# Patient Record
Sex: Female | Born: 1945 | Race: White | Hispanic: No | Marital: Married | Smoking: Never smoker
Health system: Southern US, Community
[De-identification: ages and names within clinical notes are randomized; demographics above are authoritative.]

## PROBLEM LIST (undated history)

## (undated) DIAGNOSIS — I10 Essential (primary) hypertension: Secondary | ICD-10-CM

## (undated) DIAGNOSIS — E119 Type 2 diabetes mellitus without complications: Secondary | ICD-10-CM

## (undated) DIAGNOSIS — Z8249 Family history of ischemic heart disease and other diseases of the circulatory system: Secondary | ICD-10-CM

## (undated) DIAGNOSIS — E785 Hyperlipidemia, unspecified: Secondary | ICD-10-CM

## (undated) DIAGNOSIS — E209 Hypoparathyroidism, unspecified: Secondary | ICD-10-CM

## (undated) DIAGNOSIS — Z794 Long term (current) use of insulin: Secondary | ICD-10-CM

## (undated) DIAGNOSIS — H269 Unspecified cataract: Secondary | ICD-10-CM

## (undated) HISTORY — DX: Unspecified cataract: H26.9

## (undated) HISTORY — DX: Hypoparathyroidism, unspecified: E20.9

## (undated) HISTORY — PX: CATARACT EXTRACTION, BILATERAL: SHX1313

## (undated) HISTORY — PX: BREAST CYST EXCISION: SHX579

## (undated) HISTORY — PX: TRIGGER FINGER RELEASE: SHX641

## (undated) HISTORY — PX: THYROIDECTOMY: SHX17

---

## 1998-09-18 ENCOUNTER — Other Ambulatory Visit: Admission: RE | Admit: 1998-09-18 | Discharge: 1998-09-18 | Payer: Self-pay | Admitting: *Deleted

## 1998-10-21 ENCOUNTER — Ambulatory Visit (HOSPITAL_BASED_OUTPATIENT_CLINIC_OR_DEPARTMENT_OTHER): Admission: RE | Admit: 1998-10-21 | Discharge: 1998-10-21 | Payer: Self-pay | Admitting: Orthopedic Surgery

## 1999-10-12 ENCOUNTER — Other Ambulatory Visit: Admission: RE | Admit: 1999-10-12 | Discharge: 1999-10-12 | Payer: Self-pay | Admitting: *Deleted

## 2001-08-28 ENCOUNTER — Other Ambulatory Visit: Admission: RE | Admit: 2001-08-28 | Discharge: 2001-08-28 | Payer: Self-pay | Admitting: *Deleted

## 2001-11-02 ENCOUNTER — Emergency Department (HOSPITAL_COMMUNITY): Admission: EM | Admit: 2001-11-02 | Discharge: 2001-11-02 | Payer: Self-pay | Admitting: Emergency Medicine

## 2003-01-10 ENCOUNTER — Ambulatory Visit (HOSPITAL_BASED_OUTPATIENT_CLINIC_OR_DEPARTMENT_OTHER): Admission: RE | Admit: 2003-01-10 | Discharge: 2003-01-10 | Payer: Self-pay | Admitting: Orthopedic Surgery

## 2003-01-10 ENCOUNTER — Ambulatory Visit (HOSPITAL_COMMUNITY): Admission: RE | Admit: 2003-01-10 | Discharge: 2003-01-10 | Payer: Self-pay | Admitting: Orthopedic Surgery

## 2003-08-12 ENCOUNTER — Other Ambulatory Visit: Admission: RE | Admit: 2003-08-12 | Discharge: 2003-08-12 | Payer: Self-pay | Admitting: *Deleted

## 2005-09-02 ENCOUNTER — Other Ambulatory Visit: Admission: RE | Admit: 2005-09-02 | Discharge: 2005-09-02 | Payer: Self-pay | Admitting: *Deleted

## 2011-05-23 ENCOUNTER — Ambulatory Visit (INDEPENDENT_AMBULATORY_CARE_PROVIDER_SITE_OTHER): Payer: BC Managed Care – PPO | Admitting: Family Medicine

## 2011-05-23 VITALS — BP 149/67 | HR 79 | Temp 99.0°F | Resp 16 | Ht 64.5 in | Wt 131.2 lb

## 2011-05-23 DIAGNOSIS — H109 Unspecified conjunctivitis: Secondary | ICD-10-CM

## 2011-05-23 MED ORDER — MOXIFLOXACIN HCL (2X DAY) 0.5 % OP SOLN: 1.0000 [drp] | Freq: Two times a day (BID) | OPHTHALMIC | Status: DC

## 2011-05-23 NOTE — Progress Notes (Signed)
  Patient Name: Abigail Green Date of Birth: 18-Aug-1945 Medical Record Number: [id number] Gender: female Date of Encounter: 05/23/2011  History of Present Illness:  Abigail Green is a 66 y.o. very pleasant female patient who presents with the following:  On Monday morning she noted chest congestion- used mucinex and tussin.  Then on Thursday she had nausea and vomiting then her granddaughter was born later on that day!  She felt better and went to [place].  She felt nauseated on Friday and adjusted her insulin to account for this.  Then yesterday am her right eye was red and running- then her left eye started as well.  Her eyes were crusted shut this am.  She is a Geophysicist/field seismologist.   Nausea/ vomiting is now resolved. She did have some loose stools when she was ill.   No fever.  Her glucose was 160 earlier today.   Sporadic cough.  Over all she feels better but her eyes are worry some.  Wears reading glasses only- vision is unchanged,  Eyes do not hurt- have been a little itchy.    There is no problem list on file for this patient.  No past medical history on file. No past surgical history on file. History  Substance Use Topics  . Smoking status: Never Smoker   . Smokeless tobacco: Not on file  . Alcohol Use: Not on file   No family history on file. Allergies  Allergen Reactions  . Penicillins Rash    Medication list has been reviewed and updated.  Review of Systems: As per HPI- otherwise negative.   Physical Examination: Filed Vitals:   05/23/11 1807  BP: 149/67  Pulse: 79  Temp: 99 F (37.2 C)  TempSrc: Oral  Resp: 16  Height: 5' 4.5" (1.638 m)  Weight: 131 lb 3.2 oz (59.512 kg)    Body mass index is 22.17 kg/(m^2).  GEN: WDWN, NAD, Non-toxic, A & O x 3 HEENT: Atraumatic, Normocephalic. Neck supple. No masses, No LAD. PEERL, EOMI, fundoscopic exam wnl.  Conjunctivae are injected bilateraly, some crusting .  TM, oropharynx wnl Ears and Nose: No external  deformity. CV: RRR, No M/G/R. No JVD. No thrill. No extra heart sounds. PULM: CTA B, no wheezes, crackles, rhonchi. No retractions. No resp. distress. No accessory muscle use. ABD: S, NT, ND, +BS. No rebound. No HSM. EXTR: No c/c/e NEURO Normal gait.  PSYCH: Normally interactive. Conversant. Not depressed or anxious appearing.  Calm demeanor.    Assessment and Plan: 1. Conjunctivitis  Moxifloxacin HCl 0.5 % SOLN    Use moxeza as above- let us know if not better in a couple of days-Sooner if worse.   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

## 2013-10-31 ENCOUNTER — Ambulatory Visit (HOSPITAL_COMMUNITY)
Admission: RE | Admit: 2013-10-31 | Discharge: 2013-10-31 | Disposition: A | Discharge status: Home / Self Care | Payer: Medicare Other | Source: Ambulatory Visit | Attending: Cardiovascular Disease | Admitting: Cardiovascular Disease

## 2013-10-31 ENCOUNTER — Other Ambulatory Visit (HOSPITAL_COMMUNITY): Payer: Self-pay | Admitting: Endocrinology

## 2013-10-31 DIAGNOSIS — R0609 Other forms of dyspnea: Secondary | ICD-10-CM | POA: Diagnosis not present

## 2013-10-31 DIAGNOSIS — I359 Nonrheumatic aortic valve disorder, unspecified: Secondary | ICD-10-CM

## 2013-10-31 DIAGNOSIS — Z8249 Family history of ischemic heart disease and other diseases of the circulatory system: Secondary | ICD-10-CM | POA: Diagnosis not present

## 2013-10-31 DIAGNOSIS — E785 Hyperlipidemia, unspecified: Secondary | ICD-10-CM | POA: Diagnosis not present

## 2013-10-31 DIAGNOSIS — I10 Essential (primary) hypertension: Secondary | ICD-10-CM | POA: Insufficient documentation

## 2013-10-31 DIAGNOSIS — E119 Type 2 diabetes mellitus without complications: Secondary | ICD-10-CM | POA: Diagnosis not present

## 2013-10-31 DIAGNOSIS — R0989 Other specified symptoms and signs involving the circulatory and respiratory systems: Principal | ICD-10-CM

## 2013-10-31 DIAGNOSIS — R0602 Shortness of breath: Secondary | ICD-10-CM

## 2013-10-31 NOTE — Progress Notes (Signed)
2D Echocardiogram Complete.  10/31/2013   Dayven Linsley, RDCS  

## 2015-07-31 ENCOUNTER — Encounter (HOSPITAL_COMMUNITY): Payer: Medicare Other

## 2015-08-12 ENCOUNTER — Ambulatory Visit (HOSPITAL_COMMUNITY)
Admission: RE | Admit: 2015-08-12 | Discharge: 2015-08-12 | Disposition: A | Discharge status: Home / Self Care | Payer: Medicare Other | Source: Ambulatory Visit | Attending: Vascular Surgery | Admitting: Vascular Surgery

## 2015-08-12 ENCOUNTER — Other Ambulatory Visit (HOSPITAL_COMMUNITY): Payer: Self-pay | Admitting: Endocrinology

## 2015-08-12 DIAGNOSIS — I6523 Occlusion and stenosis of bilateral carotid arteries: Secondary | ICD-10-CM | POA: Insufficient documentation

## 2015-08-12 DIAGNOSIS — R0989 Other specified symptoms and signs involving the circulatory and respiratory systems: Secondary | ICD-10-CM

## 2015-08-12 LAB — VAS US CAROTID
LCCAPSYS: 158 cm/s
LEFT ECA DIAS: 0 cm/s
LICAPSYS: -87 cm/s
Left CCA dist dias: 20 cm/s
Left CCA dist sys: 138 cm/s
Left CCA prox dias: 25 cm/s
Left ICA dist dias: -37 cm/s
Left ICA dist sys: -189 cm/s
Left ICA prox dias: -19 cm/s
RCCAPDIAS: 15 cm/s
RCCAPSYS: 116 cm/s
RIGHT CCA MID DIAS: 22 cm/s
Right cca dist sys: -86 cm/s

## 2015-12-25 ENCOUNTER — Encounter: Payer: Self-pay | Admitting: Cardiology

## 2015-12-25 ENCOUNTER — Other Ambulatory Visit: Payer: Self-pay | Admitting: Cardiology

## 2015-12-25 NOTE — Progress Notes (Signed)
Abigail Green M  Date of visit:  12/25/2015 DOB:  10-19-1945    Age:  70 yrs. Medical record number:  [id number]     Account number:  [id number] Primary Care Provider: [place], [place] A ____________________________ CURRENT DIAGNOSES  1. Chest pain, unspecified  2. Encounter for preprocedural cardiovascular examination  3. Type 1 Diabetes Mellitus Without Complications  4. Carotid artery disease-Bilateral  5. Hyperlipidemia ____________________________ ALLERGIES  Penicillins, Rash ____________________________ MEDICATIONS  1. OneTouch Verio strips  2. Lantus 100 unit/mL subcutaneous solution, 34 units daily  3. multivitamin tablet  4. Synthroid 112 mcg tablet, 1 p.o. daily except on Wed., Sun., take 1/2 pill  5. Micardis 80 mg tablet, 1 p.o. daily  6. aspirin 81 mg tablet,delayed release  7. Accu-Chek Compact Plus Test Strips  8. Humalog KwikPen 100 unit/mL subcutaneous, inject 7 units breakfst, 8 to10 units lunch, and 10 units with supper  9. Lipitor 80 mg tablet, QOD  10. Fish Oil 1,000 mg (120 mg-180 mg) capsule, 1 p.o. daily  11. Calcium 600 600 mg calcium (1,500 mg) tablet, 2 p.o. b.i.d.  12. hydrocodone-homatropine 5 mg-1.5 mg/5 mL (5 mL) syrup, PRN  13. turmeric root extract 500 mg capsule, 750 mg daily  14. Vitamin C 500 mg tablet, 1 p.o. daily ____________________________ HISTORY OF PRESENT ILLNESS This 70 year old female has a long-standing history of diabetes mellitus. She had had some chest tightness for several months prior to referral which usually occurs at night and is as described a vague tightness that would be relieved if she took aspirin. He was not associated with food was not pleuritic. She was able to walk and do other activities during the day without precipitating tightness. She has a very mild carotid artery disease. She is normotensive at home. She has a significant family history of cardiac disease with a brother having had bypass grafting, both mother and  father and other relatives with coronary artery disease. She had a myocardial perfusion scan done on the 12th and she only walked for 5 minutes and 40 seconds into Bruce stage II and had 2 mm of ST segment depression laterally associated with T wave change sin recovery. Perfusion imaging showed a mid to distal anterolateral wall defect consistent with ischemia. Ejection fraction was 63%. Catheterization is advised to further evaluate. She normally denies PND, orthopnea or edema. She doesn't have any significant complications from diabetes. Renal function has previously been normal. ____________________________ PAST HISTORY  Past Medical Illnesses:  hypoparathyroid, hypothyroid, HTN, hyperlipidemia, vitiligo, perniciious anemia, DM-insulin dependent;  Cardiovascular Illnesses:  no previous history of cardiac disease;  Surgical Procedures:  trigger finger repair, cataract extraction, cesarean section, tubal ligation, thyroidectomy, breast biopsy;  NYHA Classification:  I;  Canadian Angina Classification:  Class 0: Asymptomatic;  Cardiology Procedures-Invasive:  no previous interventional or invasive cardiology procedures;  Cardiology Procedures-Noninvasive:  treadmill Myoview October 2017;  LVEF of 63% documented via nuclear study on 12/18/2015,   ____________________________ CARDIO-PULMONARY TEST DATES EKG Date:  12/02/2015;  Nuclear Study Date:  12/18/2015;  Echocardiography Date: 10/31/2013;   ____________________________ FAMILY HISTORY Brother -- Brother dead, Myocardial infarction Brother -- Diabetes mellitus, History of coronary artery bypass grafting, Coronary Artery Disease, Brother alive with problem Father -- Father dead Mother -- Coronary Artery Disease Sister -- Sister alive with problem Sister -- Sister dead, COPD Sister -- Sister dead, Death due to natural cause Sister -- Sister alive with problem ____________________________ SOCIAL HISTORY Alcohol Use:  no alcohol use;  Smoking:   never smoked;  Diet:  caffeine use-1-2 per day;  Lifestyle:  married and 2 children;  Exercise:  exercises daily and walking 7 days per week;  Occupation:  retired;  Residence:  lives with husband;  Job Description:  Optometrist;   ____________________________ REVIEW OF SYSTEMS General:  denies recent weight change, fatique or change in exercise tolerance.  Integumentary:vitilago Eyes: cataract extraction bilaterally Ears, Nose, Throat, Mouth:  denies any hearing loss, epistaxis, hoarseness or difficulty speaking. Respiratory: cough, mild dyspnea with exertion Cardiovascular:  please review HPI Abdominal: denies dyspepsia, GI bleeding, constipation, or diarrheaGenitourinary-Female: no dysuria, urgency, frequency, UTIs, or stress incontinence Musculoskeletal:  arthritis of the right knee Neurological:  denies headaches, stroke, or TIA Psychiatric:  denies depression or anxiety Hematological/Immunologic:  denies any food allergies, bleeding disorders. ____________________________ PHYSICAL EXAMINATION VITAL SIGNS  Blood Pressure:  120/62 Sitting, Left arm, regular cuff  , 132/60 Standing, Left arm and regular cuff   Pulse:  96/min. Weight:  132.00 lbs. Height:  65"BMI: 22  Constitutional:  pleasant white female, in no acute distress Skin:  warm and dry to touch, no apparent skin lesions, or masses noted. Head:  normocephalic, normal hair pattern, no masses or tenderness Eyes:  EOMS Intact, PERRLA, C and S clear, Funduscopic exam not done. ENT:  ears, nose and throat reveal no gross abnormalities.  Dentition good. Neck:  soft right carotid bruit, healed anterior thyroid scar, no JVD Chest:  normal symmetry, clear to auscultation. Cardiac:  regular rhythm, normal S1 and S2, No S3 or S4, no murmurs, gallops or rubs detected. Abdomen:  abdomen soft,non-tender, no masses, no hepatospenomegaly, or aneurysm noted Peripheral Pulses:  the femoral,dorsalis pedis, and posterior tibial pulses are full  and equal bilaterally with no bruits auscultated. Extremities & Back:  no deformities, clubbing, cyanosis, erythema or edema observed. Normal muscle strength and tone. Neurological:  no gross motor or sensory deficits noted, affect appropriate, oriented x3. ____________________________ MOST RECENT LIPID PANEL 07/23/15  CHOL TOTL 131 mg/dl, LDL 55 NM, HDL 64 mg/dl, TRIGLYCER 62 mg/dl, ALT 18 u/l, ALK PHOS 44 u/l, CHOL/HDL 2.0 (Calc) and AST 32 u/l ____________________________ IMPRESSIONS/PLAN 1. Atypical chest tightness with an abnormal myocardial perfusion scan with both EKG signs of ischemia as well as mid distal anterolateral wall ischemia. 2. Long-standing diabetes mellitus 3. Hyperlipidemia 4. Mild carotid artery disease less than 40% bilaterally  Recommendations:  Cardiac catheterization was discussed with the patient and husband including risks of myocardial infarction, death, stroke, bleeding, arrhythmia, dye allergy, or renal insufficiency. She understands and is willing to proceed. Possibility of percutaneous intervention at the same setting was also discussed with the patient including risks. Procedure will be done by colleagues with [facility].  ____________________________ TODAYS ORDERS  1. Comprehensive Metabolic Panel: Today  2. Complete Blood Count: Today  3. Draw PT/INR: Today  4. PTT: Today  5. Cardiac cath at patient convenience                       ____________________________ Cardiology Physician:  Kerry Hough MD [facility]

## 2015-12-29 ENCOUNTER — Encounter (HOSPITAL_COMMUNITY): Payer: Self-pay | Admitting: Cardiology

## 2015-12-29 DIAGNOSIS — E109 Type 1 diabetes mellitus without complications: Secondary | ICD-10-CM

## 2015-12-29 DIAGNOSIS — Z8249 Family history of ischemic heart disease and other diseases of the circulatory system: Secondary | ICD-10-CM

## 2015-12-29 DIAGNOSIS — Z794 Long term (current) use of insulin: Secondary | ICD-10-CM

## 2015-12-29 DIAGNOSIS — I208 Other forms of angina pectoris: Secondary | ICD-10-CM | POA: Diagnosis present

## 2015-12-29 DIAGNOSIS — I2089 Other forms of angina pectoris: Secondary | ICD-10-CM | POA: Diagnosis present

## 2015-12-29 DIAGNOSIS — R9439 Abnormal result of other cardiovascular function study: Secondary | ICD-10-CM | POA: Diagnosis present

## 2015-12-29 DIAGNOSIS — I10 Essential (primary) hypertension: Secondary | ICD-10-CM

## 2015-12-29 DIAGNOSIS — E785 Hyperlipidemia, unspecified: Secondary | ICD-10-CM

## 2015-12-29 DIAGNOSIS — E119 Type 2 diabetes mellitus without complications: Secondary | ICD-10-CM

## 2015-12-29 HISTORY — DX: Family history of ischemic heart disease and other diseases of the circulatory system: Z82.49

## 2015-12-29 HISTORY — DX: Type 1 diabetes mellitus without complications: E10.9

## 2015-12-29 HISTORY — DX: Essential (primary) hypertension: I10

## 2015-12-29 HISTORY — DX: Hyperlipidemia, unspecified: E78.5

## 2015-12-30 ENCOUNTER — Encounter (HOSPITAL_COMMUNITY): Payer: Self-pay | Admitting: Cardiovascular Disease

## 2015-12-30 ENCOUNTER — Ambulatory Visit (HOSPITAL_COMMUNITY)
Admission: RE | Admit: 2015-12-30 | Discharge: 2015-12-30 | Disposition: A | Discharge status: Home / Self Care | Payer: Medicare Other | Source: Ambulatory Visit | Attending: Cardiology | Admitting: Cardiology

## 2015-12-30 ENCOUNTER — Encounter (HOSPITAL_COMMUNITY)
Admission: RE | Disposition: A | Discharge status: Home / Self Care | Payer: Self-pay | Source: Ambulatory Visit | Attending: Cardiology

## 2015-12-30 DIAGNOSIS — E209 Hypoparathyroidism, unspecified: Secondary | ICD-10-CM | POA: Diagnosis not present

## 2015-12-30 DIAGNOSIS — Z7982 Long term (current) use of aspirin: Secondary | ICD-10-CM | POA: Insufficient documentation

## 2015-12-30 DIAGNOSIS — E109 Type 1 diabetes mellitus without complications: Secondary | ICD-10-CM | POA: Diagnosis not present

## 2015-12-30 DIAGNOSIS — Z833 Family history of diabetes mellitus: Secondary | ICD-10-CM | POA: Insufficient documentation

## 2015-12-30 DIAGNOSIS — Z880 Allergy status to penicillin: Secondary | ICD-10-CM | POA: Insufficient documentation

## 2015-12-30 DIAGNOSIS — Z8249 Family history of ischemic heart disease and other diseases of the circulatory system: Secondary | ICD-10-CM | POA: Diagnosis not present

## 2015-12-30 DIAGNOSIS — R079 Chest pain, unspecified: Secondary | ICD-10-CM | POA: Insufficient documentation

## 2015-12-30 DIAGNOSIS — E039 Hypothyroidism, unspecified: Secondary | ICD-10-CM | POA: Diagnosis not present

## 2015-12-30 DIAGNOSIS — R9439 Abnormal result of other cardiovascular function study: Secondary | ICD-10-CM | POA: Diagnosis present

## 2015-12-30 DIAGNOSIS — D649 Anemia, unspecified: Secondary | ICD-10-CM | POA: Insufficient documentation

## 2015-12-30 DIAGNOSIS — I10 Essential (primary) hypertension: Secondary | ICD-10-CM | POA: Diagnosis not present

## 2015-12-30 DIAGNOSIS — I2584 Coronary atherosclerosis due to calcified coronary lesion: Secondary | ICD-10-CM | POA: Diagnosis not present

## 2015-12-30 DIAGNOSIS — E785 Hyperlipidemia, unspecified: Secondary | ICD-10-CM | POA: Diagnosis not present

## 2015-12-30 DIAGNOSIS — I2510 Atherosclerotic heart disease of native coronary artery without angina pectoris: Secondary | ICD-10-CM | POA: Diagnosis not present

## 2015-12-30 DIAGNOSIS — I208 Other forms of angina pectoris: Secondary | ICD-10-CM | POA: Diagnosis present

## 2015-12-30 DIAGNOSIS — I2089 Other forms of angina pectoris: Secondary | ICD-10-CM | POA: Diagnosis present

## 2015-12-30 DIAGNOSIS — E119 Type 2 diabetes mellitus without complications: Secondary | ICD-10-CM

## 2015-12-30 DIAGNOSIS — Z794 Long term (current) use of insulin: Secondary | ICD-10-CM

## 2015-12-30 HISTORY — DX: Type 2 diabetes mellitus without complications: E11.9

## 2015-12-30 HISTORY — DX: Family history of ischemic heart disease and other diseases of the circulatory system: Z82.49

## 2015-12-30 HISTORY — DX: Long term (current) use of insulin: Z79.4

## 2015-12-30 HISTORY — DX: Essential (primary) hypertension: I10

## 2015-12-30 HISTORY — DX: Hyperlipidemia, unspecified: E78.5

## 2015-12-30 HISTORY — PX: CARDIAC CATHETERIZATION: SHX172

## 2015-12-30 LAB — GLUCOSE, CAPILLARY
GLUCOSE-CAPILLARY: 196 mg/dL — AB (ref 65–99)
GLUCOSE-CAPILLARY: 198 mg/dL — AB (ref 65–99)

## 2015-12-30 SURGERY — LEFT HEART CATH AND CORONARY ANGIOGRAPHY
Anesthesia: LOCAL

## 2015-12-30 MED ORDER — INSULIN ASPART 100 UNIT/ML ~~LOC~~ SOLN
8.0000 [IU] | Freq: Once | SUBCUTANEOUS | Status: AC
  Administered 2015-12-30: 8 [IU] via SUBCUTANEOUS
  Filled 2015-12-30: qty 0.08

## 2015-12-30 MED ORDER — SODIUM CHLORIDE 0.9% FLUSH: 3.0000 mL | INTRAVENOUS | Status: DC | PRN

## 2015-12-30 MED ORDER — FENTANYL CITRATE (PF) 100 MCG/2ML IJ SOLN
INTRAMUSCULAR | Status: DC | PRN
  Administered 2015-12-30 (×2): 25 ug via INTRAVENOUS

## 2015-12-30 MED ORDER — IOPAMIDOL (ISOVUE-370) INJECTION 76%
INTRAVENOUS | Status: DC | PRN
  Administered 2015-12-30: 70 mL

## 2015-12-30 MED ORDER — HEPARIN SODIUM (PORCINE) 1000 UNIT/ML IJ SOLN
INTRAMUSCULAR | Status: AC
  Filled 2015-12-30: qty 1

## 2015-12-30 MED ORDER — SODIUM CHLORIDE 0.9% FLUSH: 3.0000 mL | Freq: Two times a day (BID) | INTRAVENOUS | Status: DC

## 2015-12-30 MED ORDER — SODIUM CHLORIDE 0.9 % IV SOLN: 250.0000 mL | INTRAVENOUS | Status: DC | PRN

## 2015-12-30 MED ORDER — MIDAZOLAM HCL 2 MG/2ML IJ SOLN
INTRAMUSCULAR | Status: AC
  Filled 2015-12-30: qty 2

## 2015-12-30 MED ORDER — HEPARIN (PORCINE) IN NACL 2-0.9 UNIT/ML-% IJ SOLN
INTRAMUSCULAR | Status: DC | PRN
  Administered 2015-12-30 (×2): via INTRA_ARTERIAL

## 2015-12-30 MED ORDER — HEPARIN (PORCINE) IN NACL 2-0.9 UNIT/ML-% IJ SOLN
INTRAMUSCULAR | Status: DC | PRN
  Administered 2015-12-30: 09:00:00

## 2015-12-30 MED ORDER — NITROGLYCERIN 1 MG/10 ML FOR IR/CATH LAB
INTRA_ARTERIAL | Status: AC
  Filled 2015-12-30: qty 10

## 2015-12-30 MED ORDER — MIDAZOLAM HCL 2 MG/2ML IJ SOLN
INTRAMUSCULAR | Status: DC | PRN
Start: 2015-12-30 — End: 2015-12-30
  Administered 2015-12-30 (×2): 1 mg via INTRAVENOUS
  Administered 2015-12-30: 2 mg via INTRAVENOUS

## 2015-12-30 MED ORDER — LIDOCAINE HCL (PF) 1 % IJ SOLN
INTRAMUSCULAR | Status: DC | PRN
  Administered 2015-12-30: 2 mL via SUBCUTANEOUS

## 2015-12-30 MED ORDER — INSULIN ASPART 100 UNIT/ML ~~LOC~~ SOLN
SUBCUTANEOUS | Status: AC
  Administered 2015-12-30: 8 [IU] via SUBCUTANEOUS
  Filled 2015-12-30: qty 1

## 2015-12-30 MED ORDER — VERAPAMIL HCL 2.5 MG/ML IV SOLN
INTRAVENOUS | Status: AC
  Filled 2015-12-30: qty 2

## 2015-12-30 MED ORDER — ONDANSETRON HCL 4 MG/2ML IJ SOLN: 4.0000 mg | Freq: Four times a day (QID) | INTRAMUSCULAR | Status: DC | PRN

## 2015-12-30 MED ORDER — SODIUM CHLORIDE 0.9 % WEIGHT BASED INFUSION: 1.0000 mL/kg/h | INTRAVENOUS | Status: DC

## 2015-12-30 MED ORDER — SODIUM CHLORIDE 0.9 % WEIGHT BASED INFUSION: 3.0000 mL/kg/h | INTRAVENOUS | Status: DC

## 2015-12-30 MED ORDER — LIDOCAINE HCL (PF) 1 % IJ SOLN
INTRAMUSCULAR | Status: AC
  Filled 2015-12-30: qty 30

## 2015-12-30 MED ORDER — ACETAMINOPHEN 325 MG PO TABS: 650.0000 mg | ORAL_TABLET | ORAL | Status: DC | PRN

## 2015-12-30 MED ORDER — HEPARIN (PORCINE) IN NACL 2-0.9 UNIT/ML-% IJ SOLN
INTRAMUSCULAR | Status: AC
  Filled 2015-12-30: qty 1000

## 2015-12-30 MED ORDER — ASPIRIN 81 MG PO CHEW
81.0000 mg | CHEWABLE_TABLET | ORAL | Status: AC
  Administered 2015-12-30: 81 mg via ORAL

## 2015-12-30 MED ORDER — IOPAMIDOL (ISOVUE-370) INJECTION 76%
INTRAVENOUS | Status: AC
  Filled 2015-12-30: qty 100

## 2015-12-30 MED ORDER — ASPIRIN 81 MG PO CHEW
CHEWABLE_TABLET | ORAL | Status: AC
  Filled 2015-12-30: qty 1

## 2015-12-30 MED ORDER — SODIUM CHLORIDE 0.9 % WEIGHT BASED INFUSION
3.0000 mL/kg/h | INTRAVENOUS | Status: DC
  Administered 2015-12-30: 3 mL/kg/h via INTRAVENOUS

## 2015-12-30 MED ORDER — FENTANYL CITRATE (PF) 100 MCG/2ML IJ SOLN
INTRAMUSCULAR | Status: AC
  Filled 2015-12-30: qty 2

## 2015-12-30 MED ORDER — NITROGLYCERIN 1 MG/10 ML FOR IR/CATH LAB
INTRA_ARTERIAL | Status: DC | PRN
  Administered 2015-12-30: 200 ug via INTRA_ARTERIAL

## 2015-12-30 MED ORDER — HEPARIN SODIUM (PORCINE) 1000 UNIT/ML IJ SOLN
INTRAMUSCULAR | Status: DC | PRN
  Administered 2015-12-30: 3000 [IU] via INTRAVENOUS

## 2015-12-30 SURGICAL SUPPLY — 13 items
CATH INFINITI 5 FR JL3.5 (CATHETERS) ×2 IMPLANT
CATH INFINITI JR4 5F (CATHETERS) ×2 IMPLANT
CATH LAUNCHER 5F AL1 (CATHETERS) ×1 IMPLANT
CATH SITESEER 5F NTR (CATHETERS) ×2 IMPLANT
CATHETER LAUNCHER 5F AL1 (CATHETERS) ×2
DEVICE RAD COMP TR BAND LRG (VASCULAR PRODUCTS) ×2 IMPLANT
GLIDESHEATH SLEND SS 6F .021 (SHEATH) ×2 IMPLANT
KIT HEART LEFT (KITS) ×2 IMPLANT
PACK CARDIAC CATHETERIZATION (CUSTOM PROCEDURE TRAY) ×2 IMPLANT
TRANSDUCER W/STOPCOCK (MISCELLANEOUS) ×2 IMPLANT
TUBING CIL FLEX 10 FLL-RA (TUBING) ×2 IMPLANT
WIRE HI TORQ VERSACORE-J 145CM (WIRE) ×2 IMPLANT
WIRE SAFE-T 1.5MM-J .035X260CM (WIRE) ×2 IMPLANT

## 2015-12-30 NOTE — Discharge Instructions (Signed)
Radial Site Care °Refer to this sheet in the next few weeks. These instructions provide you with information about caring for yourself after your procedure. Your health care provider may also give you more specific instructions. Your treatment has been planned according to current medical practices, but problems sometimes occur. Call your health care provider if you have any problems or questions after your procedure. °WHAT TO EXPECT AFTER THE PROCEDURE °After your procedure, it is typical to have the following: °· Bruising at the radial site that usually fades within 1-2 weeks. °· Blood collecting in the tissue (hematoma) that may be painful to the touch. It should usually decrease in size and tenderness within 1-2 weeks. °HOME CARE INSTRUCTIONS °· Take medicines only as directed by your health care provider. °· You may shower 24-48 hours after the procedure or as directed by your health care provider. Remove the bandage (dressing) and gently wash the site with plain soap and water. Pat the area dry with a clean towel. Do not rub the site, because this may cause bleeding. °· Do not take baths, swim, or use a hot tub until your health care provider approves. °· Check your insertion site every day for redness, swelling, or drainage. °· Do not apply powder or lotion to the site. °· Do not flex or bend the affected arm for 24 hours or as directed by your health care provider. °· Do not push or pull heavy objects with the affected arm for 24 hours or as directed by your health care provider. °· Do not lift over 10 lb (4.5 kg) for 5 days after your procedure or as directed by your health care provider. °· Ask your health care provider when it is okay to: °¨ Return to work or school. °¨ Resume usual physical activities or sports. °¨ Resume sexual activity. °· Do not drive home if you are discharged the same day as the procedure. Have someone else drive you. °· You may drive 24 hours after the procedure unless otherwise  instructed by your health care provider. °· Do not operate machinery or power tools for 24 hours after the procedure. °· If your procedure was done as an outpatient procedure, which means that you went home the same day as your procedure, a responsible adult should be with you for the first 24 hours after you arrive home. °· Keep all follow-up visits as directed by your health care provider. This is important. °SEEK MEDICAL CARE IF: °· You have a fever. °· You have chills. °· You have increased bleeding from the radial site. Hold pressure on the site. °SEEK IMMEDIATE MEDICAL CARE IF: °· You have unusual pain at the radial site. °· You have redness, warmth, or swelling at the radial site. °· You have drainage (other than a small amount of blood on the dressing) from the radial site. °· The radial site is bleeding, and the bleeding does not stop after 30 minutes of holding steady pressure on the site. °· Your arm or hand becomes pale, cool, tingly, or numb. °  °This information is not intended to replace advice given to you by your health care provider. Make sure you discuss any questions you have with your health care provider. °  °Document Released: 03/27/2010 Document Revised: 03/15/2014 Document Reviewed: 09/10/2013 °Elsevier Interactive Patient Education ©2016 [employer] ° °

## 2015-12-30 NOTE — H&P (View-Only) (Signed)
Abigail Green  Date of visit:  12/25/2015 DOB:  08-08-1945    Age:  70 yrs. Medical record number:  [id number]     Account number:  [phone] Primary Care Provider: [place], [place] A ____________________________ CURRENT DIAGNOSES  1. Chest pain, unspecified  2. Encounter for preprocedural cardiovascular examination  3. Type 1 Diabetes Mellitus Without Complications  4. Carotid artery disease-Bilateral  5. Hyperlipidemia ____________________________ ALLERGIES  Penicillins, Rash ____________________________ MEDICATIONS  1. OneTouch Verio strips  2. Lantus 100 unit/mL subcutaneous solution, 34 units daily  3. multivitamin tablet  4. Synthroid 112 mcg tablet, 1 p.o. daily except on Wed., Sun., take 1/2 pill  5. Micardis 80 mg tablet, 1 p.o. daily  6. aspirin 81 mg tablet,delayed release  7. Accu-Chek Compact Plus Test Strips  8. Humalog KwikPen 100 unit/mL subcutaneous, inject 7 units breakfst, 8 to10 units lunch, and 10 units with supper  9. Lipitor 80 mg tablet, QOD  10. Fish Oil 1,000 mg (120 mg-180 mg) capsule, 1 p.o. daily  11. Calcium 600 600 mg calcium (1,500 mg) tablet, 2 p.o. b.i.d.  12. hydrocodone-homatropine 5 mg-1.5 mg/5 mL (5 mL) syrup, PRN  13. turmeric root extract 500 mg capsule, 750 mg daily  14. Vitamin C 500 mg tablet, 1 p.o. daily ____________________________ HISTORY OF PRESENT ILLNESS This 70 year old female has a long-standing history of diabetes mellitus. She had had some chest tightness for several months prior to referral which usually occurs at night and is as described a vague tightness that would be relieved if she took aspirin. He was not associated with food was not pleuritic. She was able to walk and do other activities during the day without precipitating tightness. She has a very mild carotid artery disease. She is normotensive at home. She has a significant family history of cardiac disease with a brother having had bypass grafting, both mother and  father and other relatives with coronary artery disease. She had a myocardial perfusion scan done on the 12th and she only walked for 5 minutes and 40 seconds into Bruce stage II and had 2 mm of ST segment depression laterally associated with T wave change sin recovery. Perfusion imaging showed a mid to distal anterolateral wall defect consistent with ischemia. Ejection fraction was 63%. Catheterization is advised to further evaluate. She normally denies PND, orthopnea or edema. She doesn't have any significant complications from diabetes. Renal function has previously been normal. ____________________________ PAST HISTORY  Past Medical Illnesses:  hypoparathyroid, hypothyroid, HTN, hyperlipidemia, vitiligo, perniciious anemia, DM-insulin dependent;  Cardiovascular Illnesses:  no previous history of cardiac disease;  Surgical Procedures:  trigger finger repair, cataract extraction, cesarean section, tubal ligation, thyroidectomy, breast biopsy;  NYHA Classification:  I;  Canadian Angina Classification:  Class 0: Asymptomatic;  Cardiology Procedures-Invasive:  no previous interventional or invasive cardiology procedures;  Cardiology Procedures-Noninvasive:  treadmill Myoview October 2017;  LVEF of 63% documented via nuclear study on 12/18/2015,   ____________________________ CARDIO-PULMONARY TEST DATES EKG Date:  12/02/2015;  Nuclear Study Date:  12/18/2015;  Echocardiography Date: 10/31/2013;   ____________________________ FAMILY HISTORY Brother -- Brother dead, Myocardial infarction Brother -- Diabetes mellitus, History of coronary artery bypass grafting, Coronary Artery Disease, Brother alive with problem Father -- Father dead Mother -- Coronary Artery Disease Sister -- Sister alive with problem Sister -- Sister dead, COPD Sister -- Sister dead, Death due to natural cause Sister -- Sister alive with problem ____________________________ SOCIAL HISTORY Alcohol Use:  no alcohol use;  Smoking:   never smoked;  Diet:  caffeine use-1-2 per day;  Lifestyle:  married and 2 children;  Exercise:  exercises daily and walking 7 days per week;  Occupation:  retired;  Residence:  lives with husband;  Job Description:  Optometrist;   ____________________________ REVIEW OF SYSTEMS General:  denies recent weight change, fatique or change in exercise tolerance.  Integumentary:vitilago Eyes: cataract extraction bilaterally Ears, Nose, Throat, Mouth:  denies any hearing loss, epistaxis, hoarseness or difficulty speaking. Respiratory: cough, mild dyspnea with exertion Cardiovascular:  please review HPI Abdominal: denies dyspepsia, GI bleeding, constipation, or diarrheaGenitourinary-Female: no dysuria, urgency, frequency, UTIs, or stress incontinence Musculoskeletal:  arthritis of the right knee Neurological:  denies headaches, stroke, or TIA Psychiatric:  denies depression or anxiety Hematological/Immunologic:  denies any food allergies, bleeding disorders. ____________________________ PHYSICAL EXAMINATION VITAL SIGNS  Blood Pressure:  120/62 Sitting, Left arm, regular cuff  , 132/60 Standing, Left arm and regular cuff   Pulse:  96/min. Weight:  132.00 lbs. Height:  65"BMI: 22  Constitutional:  pleasant white female, in no acute distress Skin:  warm and dry to touch, no apparent skin lesions, or masses noted. Head:  normocephalic, normal hair pattern, no masses or tenderness Eyes:  EOMS Intact, PERRLA, C and S clear, Funduscopic exam not done. ENT:  ears, nose and throat reveal no gross abnormalities.  Dentition good. Neck:  soft right carotid bruit, healed anterior thyroid scar, no JVD Chest:  normal symmetry, clear to auscultation. Cardiac:  regular rhythm, normal S1 and S2, No S3 or S4, no murmurs, gallops or rubs detected. Abdomen:  abdomen soft,non-tender, no masses, no hepatospenomegaly, or aneurysm noted Peripheral Pulses:  the femoral,dorsalis pedis, and posterior tibial pulses are full  and equal bilaterally with no bruits auscultated. Extremities & Back:  no deformities, clubbing, cyanosis, erythema or edema observed. Normal muscle strength and tone. Neurological:  no gross motor or sensory deficits noted, affect appropriate, oriented x3. ____________________________ MOST RECENT LIPID PANEL 07/23/15  CHOL TOTL 131 mg/dl, LDL 55 NM, HDL 64 mg/dl, TRIGLYCER 62 mg/dl, ALT 18 u/l, ALK PHOS 44 u/l, CHOL/HDL 2.0 (Calc) and AST 32 u/l ____________________________ IMPRESSIONS/PLAN 1. Atypical chest tightness with an abnormal myocardial perfusion scan with both EKG signs of ischemia as well as mid distal anterolateral wall ischemia. 2. Long-standing diabetes mellitus 3. Hyperlipidemia 4. Mild carotid artery disease less than 40% bilaterally  Recommendations:  Cardiac catheterization was discussed with the patient and husband including risks of myocardial infarction, death, stroke, bleeding, arrhythmia, dye allergy, or renal insufficiency. She understands and is willing to proceed. Possibility of percutaneous intervention at the same setting was also discussed with the patient including risks. Procedure will be done by colleagues with [facility].  ____________________________ TODAYS ORDERS  1. Comprehensive Metabolic Panel: Today  2. Complete Blood Count: Today  3. Draw PT/INR: Today  4. PTT: Today  5. Cardiac cath at patient convenience                       ____________________________ Cardiology Physician:  Kerry Hough MD [facility]

## 2015-12-30 NOTE — Interval H&P Note (Signed)
Cath Lab Visit (complete for each Cath Lab visit)  Clinical Evaluation Leading to the Procedure:   ACS: No.  Non-ACS:    Anginal Classification: CCS II  Anti-ischemic medical therapy: No Therapy  Non-Invasive Test Results: Intermediate-risk stress test findings: cardiac mortality 1-3%/year  Prior CABG: No previous CABG      History and Physical Interval Note:  12/30/2015 7:57 AM  Abigail Green  has presented today for surgery, with the diagnosis of chest pain  The various methods of treatment have been discussed with the patient and family. After consideration of risks, benefits and other options for treatment, the patient has consented to  Procedure(s): Left Heart Cath and Coronary Angiography (N/A) as a surgical intervention .  The patient's history has been reviewed, patient examined, no change in status, stable for surgery.  I have reviewed the patient's chart and labs.  Questions were answered to the patient's satisfaction.     Tonny Bollmanooper, Oleda Borski

## 2015-12-31 MED FILL — Verapamil HCl IV Soln 2.5 MG/ML: INTRAVENOUS | Qty: 2 | Status: AC

## 2016-04-21 ENCOUNTER — Ambulatory Visit (INDEPENDENT_AMBULATORY_CARE_PROVIDER_SITE_OTHER): Payer: Medicare Other | Admitting: Emergency Medicine

## 2016-04-21 ENCOUNTER — Encounter: Payer: Self-pay | Admitting: Emergency Medicine

## 2016-04-21 VITALS — BP 132/72 | HR 82 | Ht 65.5 in | Wt 133.6 lb

## 2016-04-21 DIAGNOSIS — R05 Cough: Secondary | ICD-10-CM

## 2016-04-21 DIAGNOSIS — R053 Chronic cough: Secondary | ICD-10-CM | POA: Insufficient documentation

## 2016-04-21 DIAGNOSIS — R059 Cough, unspecified: Secondary | ICD-10-CM

## 2016-04-21 MED ORDER — FLUTICASONE PROPIONATE 50 MCG/ACT NA SUSP: 2.0000 | Freq: Every day | NASAL | 2 refills | Status: DC

## 2016-04-21 NOTE — Assessment & Plan Note (Signed)
Based on her history I suspect there is a component of chronic allergic rhinitis. She has not been on allergy regimen. She denies any GERD symptoms. I would like to start her empirically on allergy regimen see if her cough subsides. We will stop Advair and absence of any evidence of asthma. She has had some exertional dyspnea and I believe she needs pulmonary function testing to evaluate this as well as her cough. Depending we may decide to initiate a GERD regimen empirically. She may also merit bronchoscopy at some point if the cough persists.

## 2016-04-21 NOTE — Progress Notes (Signed)
Subjective:    Patient ID: Abigail Green, female    DOB: 1946/02/27, 71 y.o.   MRN: [id number]  HPI 71 year old never smoker with a history of diabetes type 1, coronary artery disease, hypertension and hyperlipidemia. Also carries a diagnosis of polyglandular autoimmune syndrome followed by Dr. Evlyn Kanner. She is referred for chronic cough. This most recent bout started in October w a cough following a URI. The URI sx resolve but she keeps a moderate cough prod of whitish mucous. In retrospect she has a pattern like this after URI's in the past. She has used mucinex, codeine cough syrup. The cough is worst and is most productive in the morning. It can wake her from sleep. Sometimes worse when she is hot. She has had some exertional SOB, has come on over the last couple years - she used to dance, but not now. She can SOB when walking up a hill. She tried Advair empirically recently, unclear that it helped her but may have. Farther back she was tried on albuterol. Currently her cough is a bit better.   She has daily nasal gtt, blows her nose a lot especially in the am. Denies any GERD or indigestion.  She recently had a L heart cath that was reassuring test. She was on an ACE-I years ago, stopped for cough.    Review of Systems  Constitutional: Negative for fever and unexpected weight change.  HENT: Negative for congestion, dental problem, ear pain, nosebleeds, postnasal drip, rhinorrhea, sinus pressure, sneezing, sore throat and trouble swallowing.   Eyes: Negative for redness and itching.  Respiratory: Positive for cough and shortness of breath. Negative for chest tightness and wheezing.   Cardiovascular: Negative for palpitations and leg swelling.  Gastrointestinal: Negative for nausea and vomiting.  Genitourinary: Negative for dysuria.  Musculoskeletal: Negative for joint swelling.  Skin: Negative for rash.  Neurological: Negative for headaches.  Hematological: Does not bruise/bleed easily.    Psychiatric/Behavioral: Negative for dysphoric mood. The patient is not nervous/anxious.    Past Medical History:  Diagnosis Date  . Diabetes mellitus type 2, insulin dependent (HCC) 12/29/2015  . Essential hypertension 12/29/2015  . Family history of premature coronary artery disease 12/29/2015  . Hyperlipidemia 12/29/2015     Family History  Problem Relation Age of Onset  . Heart failure Mother   . Heart attack Father   . COPD Sister      Social History   Social History  . Marital status: Married    Spouse name: N/A  . Number of children: N/A  . Years of education: N/A   Occupational History  . Not on file.   Social History Main Topics  . Smoking status: Never Smoker  . Smokeless tobacco: Never Used  . Alcohol use No  . Drug use: No  . Sexual activity: Not on file   Other Topics Concern  . Not on file   Social History Narrative  . No narrative on file     Allergies  Allergen Reactions  . Penicillins Rash    Has patient had a PCN reaction causing immediate rash, facial/tongue/throat swelling, SOB or lightheadedness with hypotension:YES Has patient had a PCN reaction causing severe rash involving mucus membranes or skin necrosis:unsure. Has patient had a PCN reaction that required hospitalization:PT. WAS INPATIENT WHEN REACTION OCCURRED. Has patient had a PCN reaction occurring within the last 10 years:No If all of the above answers are "NO", then may proceed with Cephalosporin use.      Outpatient  Medications Prior to Visit  Medication Sig Dispense Refill  . aspirin EC 81 MG tablet Take 81 mg by mouth every evening.    Marland Kitchen. atorvastatin (LIPITOR) 80 MG tablet Take 80 mg by mouth every other day.     . calcium carbonate (CALCIUM 600) 1500 (600 Ca) MG TABS tablet Take 1,200 mg by mouth 2 (two) times daily.    . Insulin Glargine (LANTUS [place]) Inject 35 Units into the skin daily.     . insulin lispro (HUMALOG) 100 UNIT/ML injection Inject 5-8 Units into the skin 3  (three) times daily before meals.    Marland Kitchen. levothyroxine (SYNTHROID, LEVOTHROID) 112 MCG tablet Take 112 mcg by mouth daily before breakfast. BRAND NAME ONLY    . Multiple Vitamins-Minerals (MULTIVITAMIN PO) Take 1 tablet by mouth daily.     . naproxen sodium (ANAPROX) 220 MG tablet Take 220 mg by mouth daily as needed (FOR HEADACHE.).    Marland Kitchen. Omega-3 Fatty Acids (FISH OIL) 1000 MG CAPS Take 1,000 mg by mouth daily.    Marland Kitchen. telmisartan (MICARDIS) 80 MG tablet Take 80 mg by mouth daily.    . TURMERIC PO Take 1,500 mg by mouth every evening.    . vitamin C (ASCORBIC ACID) 500 MG tablet Take 500 mg by mouth daily.     No facility-administered medications prior to visit.         Objective:   Physical Exam Vitals:   04/21/16 1027  BP: 132/72  Pulse: 82  SpO2: 97%  Weight: 133 lb 9.6 oz (60.6 kg)  Height: 5' 5.5" (1.664 m)   Gen: Pleasant, well-nourished, in no distress,  normal affect  ENT: No lesions,  mouth clear,  oropharynx clear, no postnasal drip  Neck: No JVD, no TMG, no carotid bruits  Lungs: No use of accessory muscles, clear without rales or rhonchi  Cardiovascular: RRR, heart sounds normal, no murmur or gallops, no peripheral edema  Musculoskeletal: No deformities, no cyanosis or clubbing  Neuro: alert, non focal  Skin: Warm, no lesions or rashes     Assessment & Plan:  Chronic cough Based on her history I suspect there is a component of chronic allergic rhinitis. She has not been on allergy regimen. She denies any GERD symptoms. I would like to start her empirically on allergy regimen see if her cough subsides. We will stop Advair and absence of any evidence of asthma. She has had some exertional dyspnea and I believe she needs pulmonary function testing to evaluate this as well as her cough. Depending we may decide to initiate a GERD regimen empirically. She may also merit bronchoscopy at some point if the cough persists.  Levy Pupaobert Byrum, MD, PhD 04/21/2016, 11:09 AM [facility] [phone] or if no answer [phone]

## 2016-04-21 NOTE — Patient Instructions (Addendum)
We will perform pulmonary function testing  Please start using loratadine 10mg  daily Please start fluticasone nasal spray, 2 sprays each nostril once a day.  Stop using Advair  You may use medications OTC that contain dextromethorphan. These may work better for you than mucinex  Follow with Dr Delton CoombesByrum in 1 month with full PFT

## 2016-05-19 ENCOUNTER — Ambulatory Visit (INDEPENDENT_AMBULATORY_CARE_PROVIDER_SITE_OTHER): Payer: Medicare Other | Admitting: Emergency Medicine

## 2016-05-19 ENCOUNTER — Ambulatory Visit (HOSPITAL_COMMUNITY)
Admission: RE | Admit: 2016-05-19 | Discharge: 2016-05-19 | Disposition: A | Discharge status: Home / Self Care | Payer: Medicare Other | Source: Ambulatory Visit | Attending: Emergency Medicine | Admitting: Emergency Medicine

## 2016-05-19 ENCOUNTER — Encounter: Payer: Self-pay | Admitting: Emergency Medicine

## 2016-05-19 DIAGNOSIS — R05 Cough: Secondary | ICD-10-CM

## 2016-05-19 DIAGNOSIS — J449 Chronic obstructive pulmonary disease, unspecified: Secondary | ICD-10-CM | POA: Insufficient documentation

## 2016-05-19 DIAGNOSIS — J45909 Unspecified asthma, uncomplicated: Secondary | ICD-10-CM | POA: Diagnosis not present

## 2016-05-19 DIAGNOSIS — R059 Cough, unspecified: Secondary | ICD-10-CM

## 2016-05-19 DIAGNOSIS — R053 Chronic cough: Secondary | ICD-10-CM

## 2016-05-19 LAB — PULMONARY FUNCTION TEST
DL/VA % PRED: 75 %
DL/VA: 3.74 ml/min/mmHg/L
DLCO unc % pred: 65 %
DLCO unc: 16.65 ml/min/mmHg
FEF 25-75 POST: 0.99 L/s
FEF 25-75 Pre: 1 L/sec
FEF2575-%CHANGE-POST: 0 %
FEF2575-%PRED-PRE: 52 %
FEF2575-%Pred-Post: 51 %
FEV1-%Change-Post: 0 %
FEV1-%Pred-Post: 75 %
FEV1-%Pred-Pre: 74 %
FEV1-Post: 1.76 L
FEV1-Pre: 1.75 L
FEV1FVC-%Change-Post: 1 %
FEV1FVC-%Pred-Pre: 88 %
FEV6-%CHANGE-POST: -2 %
FEV6-%Pred-Post: 84 %
FEV6-%Pred-Pre: 86 %
FEV6-POST: 2.5 L
FEV6-PRE: 2.56 L
FEV6FVC-%Change-Post: 0 %
FEV6FVC-%Pred-Post: 102 %
FEV6FVC-%Pred-Pre: 103 %
FVC-%CHANGE-POST: -1 %
FVC-%PRED-POST: 83 %
FVC-%Pred-Pre: 84 %
FVC-Post: 2.57 L
FVC-Pre: 2.61 L
POST FEV1/FVC RATIO: 68 %
Post FEV6/FVC ratio: 97 %
Pre FEV1/FVC ratio: 67 %
Pre FEV6/FVC Ratio: 98 %
RV % pred: 105 %
RV: 2.4 L
TLC % pred: 98 %
TLC: 5.13 L

## 2016-05-19 MED ORDER — ALBUTEROL SULFATE (2.5 MG/3ML) 0.083% IN NEBU
2.5000 mg | INHALATION_SOLUTION | Freq: Once | RESPIRATORY_TRACT | Status: AC
  Administered 2016-05-19: 2.5 mg via RESPIRATORY_TRACT

## 2016-05-19 NOTE — Patient Instructions (Addendum)
You should probably restart your loratadine and fluticasone nasal spray every day in the Fall and the Spring to treat allergies.  We will try albuterol to use 2 puffs if needed for shortness of breath, wheeze, coughing spells. You may want to try pre-treating exercise by 10-15 minutes.  Follow with Dr Delton CoombesByrum in 12 months or sooner if you have any problems

## 2016-05-19 NOTE — Assessment & Plan Note (Signed)
Proved with time and with more aggressive treatment of allergic rhinitis. She does not appear to need maintenance rhinitis therapy at this time but I suspect she will require it during the allergy season. We reviewed this today.

## 2016-05-19 NOTE — Progress Notes (Signed)
Subjective:    Patient ID: Abigail Green, female    DOB: 1945/10/01, 71 y.o.   MRN: [id number]  HPI 71 year old never smoker with a history of diabetes type 1, coronary artery disease, hypertension and hyperlipidemia. Also carries a diagnosis of polyglandular autoimmune syndrome followed by Dr. Evlyn Kanner. She is referred for chronic cough. This most recent bout started in October w a cough following a URI. The URI sx resolve but she keeps a moderate cough prod of whitish mucous. In retrospect she has a pattern like this after URI's in the past. She has used mucinex, codeine cough syrup. The cough is worst and is most productive in the morning. It can wake her from sleep. Sometimes worse when she is hot. She has had some exertional SOB, has come on over the last couple years - she used to dance, but not now. She can SOB when walking up a hill. She tried Advair empirically recently, unclear that it helped her but may have. Farther back she was tried on albuterol. Currently her cough is a bit better.   She has daily nasal gtt, blows her nose a lot especially in the am. Denies any GERD or indigestion.  She recently had a L heart cath that was reassuring test. She was on an ACE-I years ago, stopped for cough.   ROV 05/19/16 --  Patient follows up today for further evaluation of her chronic cough. Appears to be in the setting of this. We started her on loratadine and fluticasone nasal spray at our initial visit. She also had been experiencing some dyspnea and pulmonary function testing was done today that I have personally reviewed. Moderate obstruction without a bronchodilator response, normal lung volumes, slightly decreased diffusion capacity that does not fully correct when adjusted for alveolar volume - consistent with possible mild fixed asthma.   Her cough is better. She has been able to stop the flonase, changed the loratadine to prn. She is still clearing her throat. She still has a bit of exertional  SOB.    Review of Systems  Constitutional: Negative for fever and unexpected weight change.  HENT: Negative for congestion, dental problem, ear pain, nosebleeds, postnasal drip, rhinorrhea, sinus pressure, sneezing, sore throat and trouble swallowing.   Eyes: Negative for redness and itching.  Respiratory: Positive for cough and shortness of breath. Negative for chest tightness and wheezing.   Cardiovascular: Negative for palpitations and leg swelling.  Gastrointestinal: Negative for nausea and vomiting.  Genitourinary: Negative for dysuria.  Musculoskeletal: Negative for joint swelling.  Skin: Negative for rash.  Neurological: Negative for headaches.  Hematological: Does not bruise/bleed easily.  Psychiatric/Behavioral: Negative for dysphoric mood. The patient is not nervous/anxious.    Past Medical History:  Diagnosis Date  . Diabetes mellitus type 2, insulin dependent (HCC) 12/29/2015  . Essential hypertension 12/29/2015  . Family history of premature coronary artery disease 12/29/2015  . Hyperlipidemia 12/29/2015     Family History  Problem Relation Age of Onset  . Heart failure Mother   . Heart attack Father   . COPD Sister      Social History   Social History  . Marital status: Married    Spouse name: N/A  . Number of children: N/A  . Years of education: N/A   Occupational History  . Not on file.   Social History Main Topics  . Smoking status: Never Smoker  . Smokeless tobacco: Never Used  . Alcohol use No  . Drug use: No  .  Sexual activity: Not on file   Other Topics Concern  . Not on file   Social History Narrative  . No narrative on file     Allergies  Allergen Reactions  . Penicillins Rash    Has patient had a PCN reaction causing immediate rash, facial/tongue/throat swelling, SOB or lightheadedness with hypotension:YES Has patient had a PCN reaction causing severe rash involving mucus membranes or skin necrosis:unsure. Has patient had a PCN  reaction that required hospitalization:PT. WAS INPATIENT WHEN REACTION OCCURRED. Has patient had a PCN reaction occurring within the last 10 years:No If all of the above answers are "NO", then may proceed with Cephalosporin use.      Outpatient Medications Prior to Visit  Medication Sig Dispense Refill  . aspirin EC 81 MG tablet Take 81 mg by mouth every evening.    Marland Kitchen. atorvastatin (LIPITOR) 80 MG tablet Take 80 mg by mouth every other day.     . calcium carbonate (CALCIUM 600) 1500 (600 Ca) MG TABS tablet Take 1,200 mg by mouth 2 (two) times daily.    . Insulin Glargine (LANTUS [place]) Inject 35 Units into the skin daily.     . insulin lispro (HUMALOG) 100 UNIT/ML injection Inject 5-8 Units into the skin 3 (three) times daily before meals.    Marland Kitchen. levothyroxine (SYNTHROID, LEVOTHROID) 112 MCG tablet Take 112 mcg by mouth daily before breakfast. BRAND NAME ONLY    . Multiple Vitamins-Minerals (MULTIVITAMIN PO) Take 1 tablet by mouth daily.     . naproxen sodium (ANAPROX) 220 MG tablet Take 220 mg by mouth daily as needed (FOR HEADACHE.).    Marland Kitchen. Omega-3 Fatty Acids (FISH OIL) 1000 MG CAPS Take 1,000 mg by mouth daily.    Marland Kitchen. telmisartan (MICARDIS) 80 MG tablet Take 80 mg by mouth daily.    . vitamin C (ASCORBIC ACID) 500 MG tablet Take 500 mg by mouth daily.    . fluticasone (FLONASE) 50 MCG/ACT nasal spray Place 2 sprays into both nostrils daily. 16 g 2  . TURMERIC PO Take 1,500 mg by mouth every evening.     No facility-administered medications prior to visit.         Objective:   Physical Exam Vitals:   05/19/16 1419  BP: 120/60  Pulse: 80  SpO2: 98%  Weight: 131 lb (59.4 kg)  Height: 5' 5.5" (1.664 m)   Gen: Pleasant, well-nourished, in no distress,  normal affect  ENT: No lesions,  mouth clear,  oropharynx clear, no postnasal drip  Neck: No JVD, no TMG, no carotid bruits  Lungs: No use of accessory muscles, clear without rales or rhonchi  Cardiovascular: RRR, heart sounds normal,  no murmur or gallops, no peripheral edema  Musculoskeletal: No deformities, no cyanosis or clubbing  Neuro: alert, non focal  Skin: Warm, no lesions or rashes     Assessment & Plan:  Chronic cough Proved with time and with more aggressive treatment of allergic rhinitis. She does not appear to need maintenance rhinitis therapy at this time but I suspect she will require it during the allergy season. We reviewed this today.  Intrinsic asthma Corrie DandyMary function testing consistent with mild fixed asthma. I do not believe she needs scheduled bronchodilators but I would like for her to do a trial of albuterol as needed. We discussed the times to use it. She may also benefit from pre-treatment of exercise.   Levy Pupaobert Doniel Maiello, MD, PhD 05/19/2016, 2:45 PM [facility] [phone] or if no  answer [phone]

## 2016-05-19 NOTE — Assessment & Plan Note (Signed)
Abigail Green function testing consistent with mild fixed asthma. I do not believe she needs scheduled bronchodilators but I would like for her to do a trial of albuterol as needed. We discussed the times to use it. She may also benefit from pre-treatment of exercise.

## 2016-05-20 ENCOUNTER — Telehealth: Payer: Self-pay | Admitting: Emergency Medicine

## 2016-05-20 NOTE — Telephone Encounter (Signed)
LMTCB

## 2016-05-21 MED ORDER — ALBUTEROL SULFATE HFA 108 (90 BASE) MCG/ACT IN AERS: 2.0000 | INHALATION_SPRAY | Freq: Four times a day (QID) | RESPIRATORY_TRACT | 6 refills | Status: DC | PRN

## 2016-05-21 NOTE — Telephone Encounter (Signed)
Called and spoke with pt and she is aware that the rx for the inhaler will be sent to the pharmacy. Nothing further is needed

## 2017-06-22 ENCOUNTER — Other Ambulatory Visit: Payer: Self-pay | Admitting: Obstetrics and Gynecology

## 2017-06-22 DIAGNOSIS — R928 Other abnormal and inconclusive findings on diagnostic imaging of breast: Secondary | ICD-10-CM

## 2017-06-23 ENCOUNTER — Ambulatory Visit
Admission: RE | Admit: 2017-06-23 | Discharge: 2017-06-23 | Disposition: A | Discharge status: Home / Self Care | Payer: Medicare Other | Source: Ambulatory Visit | Attending: Obstetrics and Gynecology | Admitting: Obstetrics and Gynecology

## 2017-06-23 DIAGNOSIS — R928 Other abnormal and inconclusive findings on diagnostic imaging of breast: Secondary | ICD-10-CM

## 2018-07-11 ENCOUNTER — Other Ambulatory Visit: Payer: Self-pay | Admitting: Obstetrics and Gynecology

## 2018-07-11 DIAGNOSIS — R921 Mammographic calcification found on diagnostic imaging of breast: Secondary | ICD-10-CM

## 2018-07-13 ENCOUNTER — Ambulatory Visit
Admission: RE | Admit: 2018-07-13 | Discharge: 2018-07-13 | Disposition: A | Discharge status: Home / Self Care | Payer: Medicare Other | Source: Ambulatory Visit | Attending: Obstetrics and Gynecology | Admitting: Obstetrics and Gynecology

## 2018-07-13 ENCOUNTER — Other Ambulatory Visit: Payer: Self-pay

## 2018-07-13 DIAGNOSIS — R921 Mammographic calcification found on diagnostic imaging of breast: Secondary | ICD-10-CM

## 2019-03-28 ENCOUNTER — Ambulatory Visit: Payer: Medicare PPO | Attending: Internal Medicine

## 2019-03-28 DIAGNOSIS — Z23 Encounter for immunization: Secondary | ICD-10-CM

## 2019-03-28 NOTE — Progress Notes (Signed)
   Covid-19 Vaccination Clinic  Name:  Abigail Green    MRN: [id number] DOB: 15-May-1945  03/28/2019  [name] was observed post Covid-19 immunization for 15 minutes without incidence. She was provided with Vaccine Information Sheet and instruction to access the V-Safe system.   [name] was instructed to call 911 with any severe reactions post vaccine: Marland Kitchen Difficulty breathing  . Swelling of your face and throat  . A fast heartbeat  . A bad rash all over your body  . Dizziness and weakness    Immunizations Administered    Name Date Dose VIS Date Route   Pfizer COVID-19 Vaccine 03/28/2019  8:46 AM 0.3 mL 02/16/2019 Intramuscular   Manufacturer: [employer], [employer]   Lot: [id number]   NDC: [address]-0

## 2019-04-18 ENCOUNTER — Ambulatory Visit: Payer: Medicare PPO | Attending: Internal Medicine

## 2019-04-18 DIAGNOSIS — Z23 Encounter for immunization: Secondary | ICD-10-CM | POA: Insufficient documentation

## 2019-04-18 NOTE — Progress Notes (Signed)
   Covid-19 Vaccination Clinic  Name:  Abigail Green    MRN: [id number] DOB: [birth year]  04/18/2019  [name] was observed post Covid-19 immunization for 15 minutes without incidence. She was provided with Vaccine Information Sheet and instruction to access the V-Safe system.   [name] was instructed to call 911 with any severe reactions post vaccine: Marland Kitchen Difficulty breathing  . Swelling of your face and throat  . A fast heartbeat  . A bad rash all over your body  . Dizziness and weakness    Immunizations Administered    Name Date Dose VIS Date Route   Pfizer COVID-19 Vaccine 04/18/2019 10:43 AM 0.3 mL 02/16/2019 Intramuscular   Manufacturer: [employer], [employer]   Lot: [id number]   NDC: [address]-4

## 2019-05-31 DIAGNOSIS — I2510 Atherosclerotic heart disease of native coronary artery without angina pectoris: Secondary | ICD-10-CM | POA: Insufficient documentation

## 2019-05-31 DIAGNOSIS — I25119 Atherosclerotic heart disease of native coronary artery with unspecified angina pectoris: Secondary | ICD-10-CM

## 2019-05-31 DIAGNOSIS — E538 Deficiency of other specified B group vitamins: Secondary | ICD-10-CM

## 2019-05-31 DIAGNOSIS — N1831 Chronic kidney disease, stage 3a: Secondary | ICD-10-CM

## 2019-05-31 DIAGNOSIS — E310 Autoimmune polyglandular failure: Secondary | ICD-10-CM

## 2019-05-31 DIAGNOSIS — E1022 Type 1 diabetes mellitus with diabetic chronic kidney disease: Secondary | ICD-10-CM

## 2019-05-31 DIAGNOSIS — E039 Hypothyroidism, unspecified: Secondary | ICD-10-CM | POA: Insufficient documentation

## 2019-05-31 DIAGNOSIS — E209 Hypoparathyroidism, unspecified: Secondary | ICD-10-CM

## 2019-05-31 DIAGNOSIS — E890 Postprocedural hypothyroidism: Secondary | ICD-10-CM

## 2019-06-04 ENCOUNTER — Encounter: Payer: Self-pay | Admitting: Physician Assistant

## 2019-06-04 ENCOUNTER — Ambulatory Visit: Payer: Medicare PPO | Admitting: Physician Assistant

## 2019-06-04 ENCOUNTER — Other Ambulatory Visit (INDEPENDENT_AMBULATORY_CARE_PROVIDER_SITE_OTHER): Payer: Medicare PPO

## 2019-06-04 VITALS — BP 148/60 | HR 84 | Temp 98.5°F | Ht 63.5 in | Wt 132.1 lb

## 2019-06-04 DIAGNOSIS — D649 Anemia, unspecified: Secondary | ICD-10-CM | POA: Diagnosis not present

## 2019-06-04 DIAGNOSIS — K625 Hemorrhage of anus and rectum: Secondary | ICD-10-CM

## 2019-06-04 LAB — FERRITIN: Ferritin: 49.9 ng/mL (ref 10.0–291.0)

## 2019-06-04 MED ORDER — NA SULFATE-K SULFATE-MG SULF 17.5-3.13-1.6 GM/177ML PO SOLN: 1.0000 | Freq: Once | ORAL | 0 refills | Status: AC

## 2019-06-04 NOTE — Patient Instructions (Signed)
If you are age 74 or older, your body mass index should be between 23-30. Your Body mass index is 23.04 kg/m. If this is out of the aforementioned range listed, please consider follow up with your Primary Care Provider.  If you are age 11 or younger, your body mass index should be between 19-25. Your Body mass index is 23.04 kg/m. If this is out of the aformentioned range listed, please consider follow up with your Primary Care Provider.   You have been scheduled for a colonoscopy. Please follow written instructions given to you at your visit today.  Please pick up your prep supplies at the pharmacy within the next 1-3 days. If you use inhalers (even only as needed), please bring them with you on the day of your procedure.  Your provider has requested that you go to the basement level for lab work before leaving today. Press "B" on the elevator. The lab is located at the first door on the left as you exit the elevator.  Due to recent changes in healthcare laws, you may see the results of your imaging and laboratory studies on MyChart before your provider has had a chance to review them.  We understand that in some cases there may be results that are confusing or concerning to you. Not all laboratory results come back in the same time frame and the provider may be waiting for multiple results in order to interpret others.  Please give [place] 48 hours in order for your provider to thoroughly review all the results before contacting the office for clarification of your results.

## 2019-06-04 NOTE — Progress Notes (Signed)
Subjective:    Patient ID: Abigail Green, female    DOB: January 12, 1946, 74 y.o.   MRN: [id number]  HPI Abigail Green is a pleasant 74 year old white female, new to GI today referred by Dr. Forde Dandy, with recent drift in hemoglobin/mild anemia with most recent labs 05/17/2019 showing hemoglobin 11.7 hematocrit of 35.9 MCV of 95.9. She had also had 1 isolated episode of hematochezia noticing bright red blood on the tissue which she described as "a lot" several weeks ago.  This was not associated with any rectal pain or discomfort, she did not notice any blood mixed with the stool.  She has been watching her bowel movement since and has not seen any further blood, and denies any melenic stools.  She has no complaints of abdominal discomfort.  Appetite has been fine.  No chronic reflux symptoms or dysphagia. No NSAID use but does use a baby aspirin once daily. Family history is negative for colon cancers as far she is aware. She has not had any prior GI evaluation/colonoscopy. Other medical problems include hypertension, coronary artery disease, adult onset diabetes mellitus-insulin-dependent, hypoparathyroidism, chronic kidney disease stage III and history of polyglandular autoimmune syndrome.  B12 deficiency/pernicious anemia as listed in her past history. Patient does not recall being given that diagnosis and has not been on any B12 replacement to her recollection.  Review of Systems Pertinent positive and negative review of systems were noted in the above HPI section.  All other review of systems was otherwise negative.  Outpatient Encounter Medications as of 06/04/2019  Medication Sig  . amLODipine (NORVASC) 2.5 MG tablet Take 1 tablet by mouth daily.  Marland Kitchen aspirin EC 81 MG tablet Take 81 mg by mouth daily.  Marland Kitchen atorvastatin (LIPITOR) 80 MG tablet Take 1 tablet by mouth every evening.  . Calcium Carbonate (CALCIUM 600 PO) Take 2 tablets by mouth in the morning and at bedtime.  . insulin aspart (NOVOLOG  FLEXPEN) 100 UNIT/ML FlexPen Inject into the skin. 8-10 units at breakfast, 10-12 units at [facility] and dinner  . Insulin Glargine (LANTUS [place]) Inject 36 Units into the skin daily.   . Multiple Vitamin (MULTIVITAMIN) capsule Take 1 capsule by mouth daily.  . Multiple Vitamins-Minerals (ZINC PO) Take 1 tablet by mouth daily.  . naproxen sodium (ALEVE) 220 MG tablet Take 220 mg by mouth daily as needed.  . Omega-3 Fatty Acids (FISH OIL) 1200 MG CAPS Take 1 capsule by mouth daily.  Marland Kitchen SYNTHROID 112 MCG tablet Take 112 mcg by mouth. Every morning before breakfast and 1/2 tablet on Wednesday and Sunday  . telmisartan (MICARDIS) 80 MG tablet Take 80 mg by mouth daily.  Marland Kitchen VITAMIN D PO Take 1 tablet by mouth daily.  . Na Sulfate-K Sulfate-Mg Sulf 17.5-3.13-1.6 GM/177ML SOLN Take 1 kit by mouth once for 1 dose.  . [DISCONTINUED] fluticasone (FLONASE) 50 MCG/ACT nasal spray Place 1 spray into both nostrils daily.  . [DISCONTINUED] Insulin NPH, Human,, Isophane, (NOVOLIN N FLEXPEN) 100 UNIT/ML Kiwkpen Inject into the skin. Inject 7 units at breakfast, 8 to 10 units at lunch, 10 units at supper  . [DISCONTINUED] vitamin C (ASCORBIC ACID) 250 MG tablet Take 250 mg by mouth daily.   No facility-administered encounter medications on file as of 06/04/2019.   Allergies  Allergen Reactions  . Penicillins Rash    Has patient had a PCN reaction causing immediate rash, facial/tongue/throat swelling, SOB or lightheadedness with hypotension:YES Has patient had a PCN reaction causing severe rash involving mucus membranes or  skin necrosis:unsure. Has patient had a PCN reaction that required hospitalization:PT. WAS INPATIENT WHEN REACTION OCCURRED. Has patient had a PCN reaction occurring within the last 10 years:No If all of the above answers are "NO", then may proceed with Cephalosporin use.    Patient Active Problem List   Diagnosis Date Noted  . 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 Chronic kidney disease, stage 3a 05/31/2019  . B12 deficiency 05/31/2019  . Hypothyroidism 05/31/2019  . Coronary artery disease 05/31/2019  . Hypoparathyroidism ([place]) 05/31/2019  . Polyglandular autoimmune syndrome ([place]) 05/31/2019  . Intrinsic asthma 05/19/2016  . Chronic cough 04/21/2016  . Abnormal nuclear stress test - EKG +, Anterior Ischemia 12/29/2015  . Family history of premature coronary artery disease 12/29/2015  . Essential hypertension 12/29/2015  . Hyperlipidemia 12/29/2015  . Atypical angina ([place]) 12/29/2015   Social History   Socioeconomic History  . Marital status: Married    Spouse name: Not on file  . Number of children: 2  . Years of education: Not on file  . Highest education level: Not on file  Occupational History  . Occupation: retired   Tobacco Use  . Smoking status: Never Smoker  . Smokeless tobacco: Never Used  Substance and Sexual Activity  . Alcohol use: No  . Drug use: No  . Sexual activity: Not on file  Other Topics Concern  . Not on file  Social History Narrative  . Not on file   Social Determinants of Health   Financial Resource Strain:   . Difficulty of Paying Living Expenses:   Food Insecurity:   . Worried About Charity fundraiser in the Last Year:   . Arboriculturist in the Last Year:   Transportation Needs:   . Film/video editor (Medical):   Marland Kitchen Lack of Transportation (Non-Medical):   Physical Activity:   . Days of Exercise per Week:   . Minutes of Exercise per Session:   Stress:   . Feeling of Stress :   Social Connections:   . Frequency of Communication with Friends and Family:   . Frequency of Social Gatherings with Friends and Family:   . Attends Religious Services:   . Active Member of Clubs or Organizations:   . Attends Archivist Meetings:   Marland Kitchen Marital Status:   Intimate Partner Violence:   . Fear of Current or Ex-Partner:   . Emotionally Abused:   Marland Kitchen Physically Abused:   . Sexually Abused:      Abigail Green family history includes Addison's disease in her sister; CAD in her mother; COPD in her sister; Diabetes in her brother; Heart failure in her mother.      Objective:    Vitals:   06/04/19 1050  BP: (!) 148/60  Pulse: 84  Temp: 98.5 F (36.9 C)    Physical Exam Well-developed well-nourished older white female in no acute distress.  Pleasant height, Weight, 132 BMI 23.0  HEENT; nontraumatic normocephalic, EOMI, PER R LA, sclera anicteric. Oropharynx; not examined Neck; supple, no JVD Cardiovascular; regular rate and rhythm with S1-S2, no murmur rub or gallop Pulmonary; Clear bilaterally Abdomen; soft, nontender, nondistended, no palpable mass or hepatosplenomegaly, bowel sounds are active Rectal; not done today Skin; benign exam, no jaundice rash or appreciable lesions Extremities; no clubbing cyanosis or edema skin warm and dry Neuro/Psych; alert and oriented x4, grossly nonfocal mood and affect appropriate       Assessment & Plan:   #  34 74 year old white female with mild normocytic anemia, and recent isolated episode of hematochezia. Rule out occult colon neoplasm, rule out bleeding secondary to internal hemorrhoid.  #2 questionable prior history of pernicious anemia/B12 deficiency.  Note her MCV is on the high end of normal.  #3 coronary artery disease #4.  Polyglandular autoimmune syndrome #.  Adult onset diabetes mellitus insulin-dependent #6.  Hypoparathyroidism #7.  Chronic kidney disease stage III  Plan; Patient will be scheduled for colonoscopy with Dr. Fuller Plan.  Procedure was discussed in detail with the patient including indications risks and benefits and she is agreeable to proceed. We will check B12/folate and ferritin levels. Patient has completed COVID-19 vaccination [place] Genia Harold PA-C 06/04/2019   Cc: Reynold Bowen, MD

## 2019-06-04 NOTE — Progress Notes (Signed)
Reviewed and agree with management plan.  Mairany Bruno T. Brewer Hitchman, MD FACG [facility]  

## 2019-06-18 NOTE — Progress Notes (Signed)
Cardiology Office Note:    Date:  06/20/2019   ID:  Abigail Green, DOB 1945-04-25, MRN [id number]  PCP:  Adrian Prince, MD  Cardiologist:  No primary care provider on file.  Electrophysiologist:  None   Referring MD: Adrian Prince, MD   Chief Complaint  Patient presents with  . Coronary Artery Disease   History of Present Illness:    Abigail Green is a 74 y.o. female with a hx of type 1 diabetes, hypertension, hyperlipidemia who is referred by Dr. Evlyn Kanner for evaluation of coronary artery disease.  She reports that she gets chest pain rarely.  Occurs about once every few months.  Describes as right-sided chest pain that radiates to her back and occurs when she lies down at night.  Can last up to an hour.  She does report that she gets short of breath with minimal exertion.  For exercise, she walks her dog for about 20 minutes.  Reports feeling short of breath, particular when she has to walk up hills.  She can walk up a flight of stairs but will feel short of breath doing so.  Reports she does not check her BP regularly but usually in the 140s over 80s when she does.  She has colonoscopy planned for 07/05/2019.  Reports she had one episode of bright red blood per rectum in February.  Noticed blood on the toilet paper, not in the toilet.  Denies any bleeding since that time.  TTE on 10/31/2013 showed LVEF 65 to 70%, grade 1 diastolic dysfunction, mild AI, severe mitral annular calcification, normal RV function.  Cath on 12/30/2015 showed moderate small vessel CAD involving the first diagonal and RCA subbranches.  Past Medical History:  Diagnosis Date  . Diabetes mellitus type 2, insulin dependent (HCC) 12/29/2015  . Essential hypertension 12/29/2015  . Family history of premature coronary artery disease 12/29/2015  . Hyperlipidemia 12/29/2015  . Hypoparathyroidism [facility])     Past Surgical History:  Procedure Laterality Date  . BREAST CYST EXCISION Left   . CARDIAC CATHETERIZATION  N/A 12/30/2015   Procedure: Left Heart Cath and Coronary Angiography;  Surgeon: Tonny Bollman, MD;  Location: [facility] INVASIVE CV LAB;  Service: Cardiovascular;  Laterality: N/A;  . CESAREAN SECTION     x 2  . THYROIDECTOMY    . TRIGGER FINGER RELEASE Right    Thumb, A1 pully and incidental synovectomy of superficialis profundus tendons  . TRIGGER FINGER RELEASE Left     Current Medications: Current Meds  Medication Sig  . amLODipine (NORVASC) 5 MG tablet Take 1 tablet (5 mg total) by mouth daily.  Marland Kitchen aspirin EC 81 MG tablet Take 81 mg by mouth daily.  Marland Kitchen atorvastatin (LIPITOR) 80 MG tablet Take 1 tablet by mouth every evening.  . Calcium Carbonate (CALCIUM 600 PO) Take 2 tablets by mouth in the morning and at bedtime.  . insulin aspart (NOVOLOG FLEXPEN) 100 UNIT/ML FlexPen Inject into the skin. 8-10 units at breakfast, 10-12 units at [facility] and dinner  . Insulin Glargine (LANTUS [place]) Inject 36 Units into the skin daily.   . Multiple Vitamin (MULTIVITAMIN) capsule Take 1 capsule by mouth daily.  . Multiple Vitamins-Minerals (ZINC PO) Take 1 tablet by mouth daily.  . naproxen sodium (ALEVE) 220 MG tablet Take 220 mg by mouth daily as needed.  . Omega-3 Fatty Acids (FISH OIL) 1200 MG CAPS Take 1 capsule by mouth daily.  Marland Kitchen SYNTHROID 112 MCG tablet Take 112 mcg by mouth. Every morning before  breakfast and 1/2 tablet on Wednesday and Sunday  . telmisartan (MICARDIS) 80 MG tablet Take 80 mg by mouth daily.  Marland Kitchen VITAMIN D PO Take 1 tablet by mouth daily.  . [DISCONTINUED] amLODipine (NORVASC) 2.5 MG tablet Take 1 tablet by mouth daily.     Allergies:   Penicillins   Social History   Socioeconomic History  . Marital status: Married    Spouse name: Not on file  . Number of children: 2  . Years of education: Not on file  . Highest education level: Not on file  Occupational History  . Occupation: retired   Tobacco Use  . Smoking status: Never Smoker  . Smokeless tobacco: Never Used  Substance  and Sexual Activity  . Alcohol use: No  . Drug use: No  . Sexual activity: Not on file  Other Topics Concern  . Not on file  Social History Narrative  . Not on file   Social Determinants of Health   Financial Resource Strain:   . Difficulty of Paying Living Expenses:   Food Insecurity:   . Worried About Charity fundraiser in the Last Year:   . Arboriculturist in the Last Year:   Transportation Needs:   . Film/video editor (Medical):   Marland Kitchen Lack of Transportation (Non-Medical):   Physical Activity:   . Days of Exercise per Week:   . Minutes of Exercise per Session:   Stress:   . Feeling of Stress :   Social Connections:   . Frequency of Communication with Friends and Family:   . Frequency of Social Gatherings with Friends and Family:   . Attends Religious Services:   . Active Member of Clubs or Organizations:   . Attends Archivist Meetings:   Marland Kitchen Marital Status:      Family History: The patient's family history includes Addison's disease in her sister; CAD in her mother; COPD in her sister; Diabetes in her brother; Heart failure in her mother.  ROS:   Please see the history of present illness.     All other systems reviewed and are negative.  EKGs/Labs/Other Studies Reviewed:    The following studies were reviewed today:   EKG:  EKG is  ordered today.  The ekg ordered today demonstrates normal sinus rhythm, rate 72, no ST/T abnormalities  TTE 10/31/13: - Left ventricle: The cavity size was normal. Systolic function was  vigorous. The estimated ejection fraction was in the range of 65%  to 70%. Wall motion was normal; there were no regional wall  motion abnormalities. Doppler parameters are consistent with  abnormal left ventricular relaxation (grade 1 diastolic  dysfunction). Doppler parameters are consistent with elevated  ventricular end-diastolic filling pressure.  - Aortic valve: Trileaflet; mildly thickened leaflets.  Transvalvular  velocity was within the normal range. There was no  stenosis. There was mild regurgitation.  - Aortic root: The aortic root was normal in size.  - Mitral valve: Severe mitral annular calcification, predominantly  posterior.  - Left atrium: The atrium was mildly dilated.  - Right ventricle: Systolic function was normal.  - Right atrium: The atrium was normal in size.  - Pulmonary arteries: Systolic pressure was within the normal  range.  - Pericardium, extracardiac: There was no pericardial effusion.   Impressions:   - Normal biventricular size and systolic function.  Abnormal relaxation with elevated filling pressures.  Mild aortic regurgitation.   Cath 12/30/15: 1. Moderate small vessel coronary artery disease involving the  first diagonal and RCA subbranches. 2. Widely patent RCA, LAD, left main, and left circumflex vessels 3. Normal LVEDP 4. Heavily calcified mitral annulus  Recommendations: Medical therapy   Recent Labs: No results found for requested labs within last 8760 hours.  Recent Lipid Panel No results found for: CHOL, TRIG, HDL, CHOLHDL, VLDL, LDLCALC, LDLDIRECT  Physical Exam:    VS:  BP (!) 150/79   Pulse 72   Temp (!) 97.1 F (36.2 C)   Ht 5\' 3"  (1.6 m)   Wt 129 lb 9.6 oz (58.8 kg)   SpO2 97%   BMI 22.96 kg/m     Wt Readings from Last 3 Encounters:  06/20/19 129 lb 9.6 oz (58.8 kg)  06/04/19 132 lb 2 oz (59.9 kg)  05/19/16 131 lb (59.4 kg)     GEN:   in no acute distress HEENT: Normal NECK: No JVD CARDIAC: RRR, no murmurs, rubs, gallops RESPIRATORY:  Clear to auscultation without rales, wheezing or rhonchi  ABDOMEN: Soft, non-tender, non-distended MUSCULOSKELETAL:  No edema; No deformity  SKIN: Warm and dry NEUROLOGIC:  Alert and oriented x 3 PSYCHIATRIC:  Normal affect   ASSESSMENT:    1. Shortness of breath   2. Coronary artery disease involving native coronary artery of native heart, angina presence unspecified   3. Pre-op  evaluation   4. Essential hypertension   5. Hyperlipidemia, unspecified hyperlipidemia type   6. Aortic valve insufficiency, etiology of cardiac valve disease unspecified    PLAN:     CAD: Cath on 12/30/2015 showed moderate small vessel CAD involving the first diagonal and RCA subbranches.  Normal LV systolic function on TTE in 2015.  Having shortness of breath with minimal exertion -Continue aspirin 81 mg daily. -Continue atorvastatin 80 mg daily -Lexiscan Myoview to evaluate for ischemia  Aortic regurgitation: Mild on TTE 2015.  Will recheck echo  Preoperative Evaluation: Prior to colonoscopy.  Low risk procedure, no cardiac work-up required.  However given dyspnea with minimal exertion, planning Lexiscan Myoview/TTE as above  Hyperlipidemia: On atorvastatin 80 mg daily.  LDL 50 on 09/26/18  Hypertension: On telmisartan 80 mg daily and amlodipine 2.5 mg daily.  BP elevated, will increase amlodipine to 5 mg daily.  Asked patient to check her BP daily for next 2 weeks and call with results  Type 2 diabetes: On insulin  RTC in 1 month  Medication Adjustments/Labs and Tests Ordered: Current medicines are reviewed at length with the patient today.  Concerns regarding medicines are outlined above.  Orders Placed This Encounter  Procedures  . MYOCARDIAL PERFUSION IMAGING  . EKG 12-Lead  . ECHOCARDIOGRAM COMPLETE   Meds ordered this encounter  Medications  . amLODipine (NORVASC) 5 MG tablet    Sig: Take 1 tablet (5 mg total) by mouth daily.    Dispense:  90 tablet    Refill:  3    Dose increase    Patient Instructions  Medication Instructions:  INCREASE amlodipine to 5 mg daily  *If you need a refill on your cardiac medications before your next appointment, please call your pharmacy*  Testing/Procedures: Your physician has requested that you have an echocardiogram (PRIOR TO 4/29). Echocardiography is a painless test that uses sound waves to create images of your heart. It  provides your doctor with information about the size and shape of your heart and how well your heart's chambers and valves are working. This procedure takes approximately one hour. There are no restrictions for this procedure. This will be done  at our [facility] location:  [address]  Your physician has requested that you have a lexiscan myoview (PRIOR TO 4/29). For further information please visit [email or web address] Please follow instruction sheet, as given.  Follow-Up: At [facility], you and your health needs are our priority.  As part of our continuing mission to provide you with exceptional heart care, we have created designated Provider Care Teams.  These Care Teams include your primary Cardiologist (physician) and Advanced Practice Providers (APPs -  Physician Assistants and Nurse Practitioners) who all work together to provide you with the care you need, when you need it.  We recommend signing up for the patient portal called "MyChart".  Sign up information is provided on this After Visit Summary.  MyChart is used to connect with patients for Virtual Visits (Telemedicine).  Patients are able to view lab/test results, encounter notes, upcoming appointments, etc.  Non-urgent messages can be sent to your provider as well.   To learn more about what you can do with MyChart, go to [email or web address].    Your next appointment:   1 month(s)  The format for your next appointment:   In Person  Provider:   Epifanio Lesches, MD   Other Instructions Please check your blood pressure daily at home and write it down.  Call the office in 2 weeks with blood pressure readings for Dr. Bjorn Pippin to review.      Signed, Little Ishikawa, MD  06/20/2019 5:43 PM    [facility]

## 2019-06-20 ENCOUNTER — Ambulatory Visit: Payer: Medicare PPO | Admitting: Cardiology

## 2019-06-20 ENCOUNTER — Encounter: Payer: Self-pay | Admitting: Cardiology

## 2019-06-20 ENCOUNTER — Other Ambulatory Visit: Payer: Self-pay

## 2019-06-20 ENCOUNTER — Telehealth (HOSPITAL_COMMUNITY): Payer: Self-pay

## 2019-06-20 VITALS — BP 150/79 | HR 72 | Temp 97.1°F | Ht 63.0 in | Wt 129.6 lb

## 2019-06-20 DIAGNOSIS — R0602 Shortness of breath: Secondary | ICD-10-CM | POA: Diagnosis not present

## 2019-06-20 DIAGNOSIS — I10 Essential (primary) hypertension: Secondary | ICD-10-CM

## 2019-06-20 DIAGNOSIS — I351 Nonrheumatic aortic (valve) insufficiency: Secondary | ICD-10-CM

## 2019-06-20 DIAGNOSIS — I2510 Atherosclerotic heart disease of native coronary artery without angina pectoris: Secondary | ICD-10-CM

## 2019-06-20 DIAGNOSIS — E785 Hyperlipidemia, unspecified: Secondary | ICD-10-CM

## 2019-06-20 DIAGNOSIS — Z01818 Encounter for other preprocedural examination: Secondary | ICD-10-CM

## 2019-06-20 MED ORDER — AMLODIPINE BESYLATE 5 MG PO TABS: 5.0000 mg | ORAL_TABLET | Freq: Every day | ORAL | 3 refills | Status: DC

## 2019-06-20 NOTE — Patient Instructions (Signed)
Medication Instructions:  INCREASE amlodipine to 5 mg daily  *If you need a refill on your cardiac medications before your next appointment, please call your pharmacy*  Testing/Procedures: Your physician has requested that you have an echocardiogram (PRIOR TO 4/29). Echocardiography is a painless test that uses sound waves to create images of your heart. It provides your doctor with information about the size and shape of your heart and how well your heart's chambers and valves are working. This procedure takes approximately one hour. There are no restrictions for this procedure. This will be done at our [facility] location:  [address]  Your physician has requested that you have a lexiscan myoview (PRIOR TO 4/29). For further information please visit [email or web address] Please follow instruction sheet, as given.  Follow-Up: At [facility], you and your health needs are our priority.  As part of our continuing mission to provide you with exceptional heart care, we have created designated Provider Care Teams.  These Care Teams include your primary Cardiologist (physician) and Advanced Practice Providers (APPs -  Physician Assistants and Nurse Practitioners) who all work together to provide you with the care you need, when you need it.  We recommend signing up for the patient portal called "MyChart".  Sign up information is provided on this After Visit Summary.  MyChart is used to connect with patients for Virtual Visits (Telemedicine).  Patients are able to view lab/test results, encounter notes, upcoming appointments, etc.  Non-urgent messages can be sent to your provider as well.   To learn more about what you can do with MyChart, go to [email or web address].    Your next appointment:   1 month(s)  The format for your next appointment:   In Person  Provider:   Epifanio Lesches, MD   Other Instructions Please check your blood pressure daily at home and  write it down.  Call the office in 2 weeks with blood pressure readings for Dr. Bjorn Pippin to review.

## 2019-06-20 NOTE — Telephone Encounter (Signed)
Encounter complete. 

## 2019-06-22 ENCOUNTER — Other Ambulatory Visit: Payer: Self-pay

## 2019-06-22 ENCOUNTER — Ambulatory Visit (HOSPITAL_COMMUNITY)
Admission: RE | Admit: 2019-06-22 | Discharge: 2019-06-22 | Disposition: A | Discharge status: Home / Self Care | Payer: Medicare PPO | Source: Ambulatory Visit | Attending: Cardiovascular Disease | Admitting: Cardiovascular Disease

## 2019-06-22 DIAGNOSIS — R0602 Shortness of breath: Secondary | ICD-10-CM

## 2019-06-22 LAB — MYOCARDIAL PERFUSION IMAGING
LV dias vol: 85 mL (ref 46–106)
LV sys vol: 29 mL
Peak HR: 103 {beats}/min
Rest HR: 77 {beats}/min
SDS: 5
SRS: 2
SSS: 7
TID: 1

## 2019-06-22 MED ORDER — REGADENOSON 0.4 MG/5ML IV SOLN
0.4000 mg | Freq: Once | INTRAVENOUS | Status: AC
  Administered 2019-06-22: 0.4 mg via INTRAVENOUS

## 2019-06-22 MED ORDER — TECHNETIUM TC 99M TETROFOSMIN IV KIT
11.0000 | PACK | Freq: Once | INTRAVENOUS | Status: AC | PRN
  Administered 2019-06-22: 11 via INTRAVENOUS
  Filled 2019-06-22: qty 11

## 2019-06-22 MED ORDER — TECHNETIUM TC 99M TETROFOSMIN IV KIT
32.1000 | PACK | Freq: Once | INTRAVENOUS | Status: AC | PRN
  Administered 2019-06-22: 32.1 via INTRAVENOUS
  Filled 2019-06-22: qty 33

## 2019-06-29 ENCOUNTER — Other Ambulatory Visit: Payer: Self-pay

## 2019-06-29 ENCOUNTER — Ambulatory Visit (HOSPITAL_COMMUNITY): Payer: Medicare PPO | Attending: Cardiovascular Disease

## 2019-06-29 DIAGNOSIS — R0602 Shortness of breath: Secondary | ICD-10-CM | POA: Insufficient documentation

## 2019-07-02 ENCOUNTER — Telehealth: Payer: Self-pay

## 2019-07-02 NOTE — Telephone Encounter (Signed)
Yes she is cleared from a cardiac standpoint to undergo the procedure.  Recent stress test was normal and no significant abnormalities on echocardiogram.  No further cardiac work-up is recommended. Thanks, Thayer Ohm

## 2019-07-02 NOTE — Telephone Encounter (Signed)
-----   Message from Meryl Dare, MD sent at 06/29/2019  9:13 AM EDT ----- Regarding: RE: [facility] pt Marchelle Folks,  Please confirm date of echocardiogram. If not performed at least several days prior to [facility] procedure please reschedule her procedure at a later date.   MS  ----- Message ----- From: Cathlyn Parsons, CRNA Sent: 06/29/2019   9:06 AM EDT To: Meryl Dare, MD Subject: [facility] pt                                         Dr. Russella Dar,  This pt is scheduled with you on April 29.  Last week she had a cardiac w/u for SOB with exertion and it appears the Cardiac Echo was not completed.  Could you request cardiac clearance for her [facility] procedure?  Thanks,  Cathlyn Parsons

## 2019-07-02 NOTE — Telephone Encounter (Signed)
   Abigail Green 08-25-1945 [id number]  Dear Dr. Bjorn Pippin,  We have scheduled the above named patient for a(n) Colonoscopy procedure.   Please advise as to whether the patient may be cleared from a Cardiology standpoint. Please route your response to Jillene Bucks, CMA

## 2019-07-03 ENCOUNTER — Telehealth: Payer: Self-pay | Admitting: Physician Assistant

## 2019-07-03 NOTE — Telephone Encounter (Signed)
Patient called to let us know that she had a stress test a few weeks ago along with an echo test last week has procedure scheduled for tomorrow

## 2019-07-03 NOTE — Telephone Encounter (Signed)
Patient is cleared for procedure. Spoke with the patient. She asks about her Lantus insulin. Referred to her instructions.

## 2019-07-05 ENCOUNTER — Encounter: Payer: Self-pay | Admitting: Gastroenterology

## 2019-07-05 ENCOUNTER — Other Ambulatory Visit: Payer: Self-pay

## 2019-07-05 ENCOUNTER — Ambulatory Visit (AMBULATORY_SURGERY_CENTER): Payer: Medicare PPO | Admitting: Gastroenterology

## 2019-07-05 VITALS — BP 97/46 | HR 70 | Temp 96.8°F | Resp 15 | Ht 63.0 in | Wt 132.0 lb

## 2019-07-05 DIAGNOSIS — K640 First degree hemorrhoids: Secondary | ICD-10-CM | POA: Diagnosis not present

## 2019-07-05 DIAGNOSIS — D649 Anemia, unspecified: Secondary | ICD-10-CM

## 2019-07-05 DIAGNOSIS — K921 Melena: Secondary | ICD-10-CM

## 2019-07-05 MED ORDER — SODIUM CHLORIDE 0.9 % IV SOLN: 500.0000 mL | Freq: Once | INTRAVENOUS | Status: DC

## 2019-07-05 NOTE — Progress Notes (Signed)
PT taken to PACU. Monitors in place. VSS. Report given to RN. 

## 2019-07-05 NOTE — Progress Notes (Signed)
Temp-JB VS-CW  CBG 305-Dr. Russella Dar made aware. No orders received for insulin. Will re-check CBG in Recovery.

## 2019-07-05 NOTE — Patient Instructions (Signed)
Information on hemorrhoids given to you today.  YOU HAD AN ENDOSCOPIC PROCEDURE TODAY AT THE [facility]:   Refer to the procedure report that was given to you for any specific questions about what was found during the examination.  If the procedure report does not answer your questions, please call your gastroenterologist to clarify.  If you requested that your care partner not be given the details of your procedure findings, then the procedure report has been included in a sealed envelope for you to review at your convenience later.  YOU SHOULD EXPECT: Some feelings of bloating in the abdomen. Passage of more gas than usual.  Walking can help get rid of the air that was put into your GI tract during the procedure and reduce the bloating. If you had a lower endoscopy (such as a colonoscopy or flexible sigmoidoscopy) you may notice spotting of blood in your stool or on the toilet paper. If you underwent a bowel prep for your procedure, you may not have a normal bowel movement for a few days.  Please Note:  You might notice some irritation and congestion in your nose or some drainage.  This is from the oxygen used during your procedure.  There is no need for concern and it should clear up in a day or so.  SYMPTOMS TO REPORT IMMEDIATELY:   Following lower endoscopy (colonoscopy or flexible sigmoidoscopy):  Excessive amounts of blood in the stool  Significant tenderness or worsening of abdominal pains  Swelling of the abdomen that is new, acute  Fever of 100F or higher  For urgent or emergent issues, a gastroenterologist can be reached at any hour by calling [phone]. Do not use MyChart messaging for urgent concerns.    DIET:  We do recommend a small meal at first, but then you may proceed to your regular diet.  Drink plenty of fluids but you should avoid alcoholic beverages for 24 hours.  ACTIVITY:  You should plan to take it easy for the rest of today and you should NOT  DRIVE or use heavy machinery until tomorrow (because of the sedation medicines used during the test).    FOLLOW UP: Our staff will call the number listed on your records 48-72 hours following your procedure to check on you and address any questions or concerns that you may have regarding the information given to you following your procedure. If we do not reach you, we will leave a message.  We will attempt to reach you two times.  During this call, we will ask if you have developed any symptoms of COVID 19. If you develop any symptoms (ie: fever, flu-like symptoms, shortness of breath, cough etc.) before then, please call [phone].  If you test positive for Covid 19 in the 2 weeks post procedure, please call and report this information to [place].    If any biopsies were taken you will be contacted by phone or by letter within the next 1-3 weeks.  Please call us at [phone] if you have not heard about the biopsies in 3 weeks.    SIGNATURES/CONFIDENTIALITY: You and/or your care partner have signed paperwork which will be entered into your electronic medical record.  These signatures attest to the fact that that the information above on your After Visit Summary has been reviewed and is understood.  Full responsibility of the confidentiality of this discharge information lies with you and/or your care-partner.

## 2019-07-05 NOTE — Op Note (Signed)
[facility] Patient Name: Jaxyn Mestas Procedure Date: 07/05/2019 7:55 AM MRN: [id number] Endoscopist: Meryl Dare , MD Age: 74 Referring MD:  Date of Birth: 07/22/1945 Gender: Female Account #: [id number] Procedure:                Colonoscopy Indications:              Hematochezia, anemia unspecified Medicines:                Monitored Anesthesia Care Procedure:                Pre-Anesthesia Assessment:                           - Prior to the procedure, a History and Physical                            was performed, and patient medications and                            allergies were reviewed. The patient's tolerance of                            previous anesthesia was also reviewed. The risks                            and benefits of the procedure and the sedation                            options and risks were discussed with the patient.                            All questions were answered, and informed consent                            was obtained. Prior Anticoagulants: The patient has                            taken no previous anticoagulant or antiplatelet                            agents. ASA Grade Assessment: II - A patient with                            mild systemic disease. After reviewing the risks                            and benefits, the patient was deemed in                            satisfactory condition to undergo the procedure.                           After obtaining informed consent, the colonoscope  was passed under direct vision. Throughout the                            procedure, the patient's blood pressure, pulse, and                            oxygen saturations were monitored continuously. The                            Colonoscope was introduced through the anus and                            advanced to the the cecum, identified by                            appendiceal orifice and ileocecal  valve. The                            ileocecal valve, appendiceal orifice, and rectum                            were photographed. The quality of the bowel                            preparation was good. The colonoscopy was performed                            without difficulty. The patient tolerated the                            procedure well. Scope In: 8:35:21 AM Scope Out: 8:53:39 AM Scope Withdrawal Time: 0 hours 11 minutes 18 seconds  Total Procedure Duration: 0 hours 18 minutes 18 seconds  Findings:                 The perianal and digital rectal examinations were                            normal.                           Internal hemorrhoids were found during                            retroflexion. The hemorrhoids were small and Grade                            I (internal hemorrhoids that do not prolapse).                           The exam was otherwise without abnormality on                            direct and retroflexion views. Complications:            No immediate complications. Estimated blood loss:  None. Estimated Blood Loss:     Estimated blood loss: none. Impression:               - Internal hemorrhoids.                           - The examination was otherwise normal on direct                            and retroflexion views.                           - No specimens collected. Recommendation:           - Patient has a contact number available for                            emergencies. The signs and symptoms of potential                            delayed complications were discussed with the                            patient. Return to normal activities tomorrow.                            Written discharge instructions were provided to the                            patient.                           - Resume previous diet.                           - Continue present medications.                           - No repeat  colonoscopy due to age and the absence                            of advanced adenomas.                           - Follow up with PCP. GI follow up prn. Ladene Artist, MD 07/05/2019 8:56:35 AM This report has been signed electronically.

## 2019-07-09 ENCOUNTER — Telehealth: Payer: Self-pay

## 2019-07-09 NOTE — Telephone Encounter (Signed)
  Follow up Call-  Call back number 07/05/2019  Post procedure Call Back phone  # [phone]  Permission to leave phone message Yes  Some recent data might be hidden     Patient questions:  Do you have a fever, pain , or abdominal swelling? No. Pain Score  0 *  Have you tolerated food without any problems? Yes.    Have you been able to return to your normal activities? Yes.    Do you have any questions about your discharge instructions: Diet   No. Medications  No. Follow up visit  No.  Do you have questions or concerns about your Care? No.  Actions: * If pain score is 4 or above: No action needed, pain <4. 1. Have you developed a fever since your procedure? no  2.   Have you had an respiratory symptoms (SOB or cough) since your procedure? no  3.   Have you tested positive for COVID 19 since your procedure no  4.   Have you had any family members/close contacts diagnosed with the COVID 19 since your procedure?  no   If yes to any of these questions please route to Laverna Peace, RN and Charlett Lango, RN

## 2019-07-24 ENCOUNTER — Ambulatory Visit: Payer: Medicare PPO | Admitting: Cardiology

## 2019-07-24 ENCOUNTER — Other Ambulatory Visit: Payer: Self-pay

## 2019-07-24 VITALS — BP 152/60 | HR 79 | Ht 63.0 in | Wt 132.2 lb

## 2019-07-24 DIAGNOSIS — I10 Essential (primary) hypertension: Secondary | ICD-10-CM | POA: Diagnosis not present

## 2019-07-24 DIAGNOSIS — I351 Nonrheumatic aortic (valve) insufficiency: Secondary | ICD-10-CM

## 2019-07-24 DIAGNOSIS — E785 Hyperlipidemia, unspecified: Secondary | ICD-10-CM | POA: Diagnosis not present

## 2019-07-24 DIAGNOSIS — I2510 Atherosclerotic heart disease of native coronary artery without angina pectoris: Secondary | ICD-10-CM | POA: Diagnosis not present

## 2019-07-24 MED ORDER — AMLODIPINE BESYLATE 10 MG PO TABS: 10.0000 mg | ORAL_TABLET | Freq: Every day | ORAL | 3 refills | Status: DC

## 2019-07-24 NOTE — Progress Notes (Signed)
Cardiology Office Note:    Date:  07/24/2019   ID:  Abigail Green, DOB 04/14/1945, MRN [id number]  PCP:  Reynold Bowen, MD  Cardiologist:  Donato Heinz, MD  Electrophysiologist:  None   Referring MD: Reynold Bowen, MD   Chief Complaint  Patient presents with  . Coronary Artery Disease   History of Present Illness:    Abigail Green is a 74 y.o. female with a hx of type 1 diabetes, hypertension, hyperlipidemia who presents for follow-up.  She was referred by Dr. Forde Dandy for evaluation of coronary artery disease, initially seen on 06/20/2019.  She reports that she gets chest pain rarely.  Occurs about once every few months.  Describes as right-sided chest pain that radiates to her back and occurs when she lies down at night.  Can last up to an hour.  She does report that she gets short of breath with minimal exertion.  For exercise, she walks her dog for about 20 minutes.  Reports feeling short of breath, particular when she has to walk up hills.  She can walk up a flight of stairs but will feel short of breath doing so.    TTE on 10/31/2013 showed LVEF 65 to 77%, grade 1 diastolic dysfunction, mild AI, severe mitral annular calcification, normal RV function.  Cath on 12/30/2015 showed moderate small vessel CAD involving the first diagonal and RCA subbranches.  TTE on 06/29/2019 showed normal biventricular function, mild mitral stenosis, mild aortic regurgitation.  Lexiscan Myoview on 06/22/2019 showed normal perfusion.  Since last clinic visit, she reports that she has been doing well.  Denies any chest pain or dyspnea.  Denies any lightheadedness, syncope, palpitations.  She brought her BP log with her, has been 130s to 150s over 60s to 80s.   Past Medical History:  Diagnosis Date  . Cataract    removed bilaterally  . Diabetes mellitus type 1 ([place]) 12/29/2015  . Essential hypertension 12/29/2015  . Family history of premature coronary artery disease 12/29/2015  .  Hyperlipidemia 12/29/2015  . Hypoparathyroidism [facility])     Past Surgical History:  Procedure Laterality Date  . BREAST CYST EXCISION Left   . CARDIAC CATHETERIZATION N/A 12/30/2015   Procedure: Left Heart Cath and Coronary Angiography;  Surgeon: Sherren Mocha, MD;  Location: [facility];  Service: Cardiovascular;  Laterality: N/A;  . CATARACT EXTRACTION, BILATERAL    . CESAREAN SECTION     x 2  . THYROIDECTOMY    . TRIGGER FINGER RELEASE Right    Thumb, A1 pully and incidental synovectomy of superficialis profundus tendons  . TRIGGER FINGER RELEASE Left     Current Medications: Current Meds  Medication Sig  . ACCU-CHEK GUIDE test strip   . Accu-Chek Softclix Lancets lancets   . amLODipine (NORVASC) 10 MG tablet Take 1 tablet (10 mg total) by mouth daily.  Marland Kitchen aspirin EC 81 MG tablet Take 81 mg by mouth daily.  Marland Kitchen atorvastatin (LIPITOR) 80 MG tablet Take 1 tablet by mouth every evening.  . Calcium Carbonate (CALCIUM 600 PO) Take 2 tablets by mouth in the morning and at bedtime.  . insulin aspart (NOVOLOG FLEXPEN) 100 UNIT/ML FlexPen Inject into the skin. 8-10 units at breakfast, 10-12 units at [facility] and dinner  . Insulin Glargine (LANTUS [place]) Inject 36 Units into the skin daily.   . Multiple Vitamin (MULTIVITAMIN) capsule Take 1 capsule by mouth daily.  . Multiple Vitamins-Minerals (ZINC PO) Take 1 tablet by mouth daily.  Marland Kitchen  naproxen sodium (ALEVE) 220 MG tablet Take 220 mg by mouth daily as needed.  . Omega-3 Fatty Acids (FISH OIL) 1200 MG CAPS Take 1 capsule by mouth daily.  Marland Kitchen SYNTHROID 112 MCG tablet Take 112 mcg by mouth. Every morning before breakfast and 1/2 tablet on Wednesday and Sunday  . telmisartan (MICARDIS) 80 MG tablet Take 80 mg by mouth daily.  Marland Kitchen VITAMIN D PO Take 1 tablet by mouth daily.  . [DISCONTINUED] amLODipine (NORVASC) 5 MG tablet Take 1 tablet (5 mg total) by mouth daily.     Allergies:   Penicillins   Social History   Socioeconomic History  .  Marital status: Married    Spouse name: Not on file  . Number of children: 2  . Years of education: Not on file  . Highest education level: Not on file  Occupational History  . Occupation: retired   Tobacco Use  . Smoking status: Never Smoker  . Smokeless tobacco: Never Used  Substance and Sexual Activity  . Alcohol use: No  . Drug use: No  . Sexual activity: Not on file  Other Topics Concern  . Not on file  Social History Narrative  . Not on file   Social Determinants of Health   Financial Resource Strain:   . Difficulty of Paying Living Expenses:   Food Insecurity:   . Worried About Programme researcher, broadcasting/film/video in the Last Year:   . Barista in the Last Year:   Transportation Needs:   . Freight forwarder (Medical):   Marland Kitchen Lack of Transportation (Non-Medical):   Physical Activity:   . Days of Exercise per Week:   . Minutes of Exercise per Session:   Stress:   . Feeling of Stress :   Social Connections:   . Frequency of Communication with Friends and Family:   . Frequency of Social Gatherings with Friends and Family:   . Attends Religious Services:   . Active Member of Clubs or Organizations:   . Attends Banker Meetings:   Marland Kitchen Marital Status:      Family History: The patient's family history includes Addison's disease in her sister; CAD in her mother; COPD in her sister; Diabetes in her brother; Heart failure in her mother. There is no history of Colon cancer, Esophageal cancer, Rectal cancer, or Stomach cancer.  ROS:   Please see the history of present illness.     All other systems reviewed and are negative.  EKGs/Labs/Other Studies Reviewed:    The following studies were reviewed today:   EKG:  EKG is  ordered today.  The ekg ordered today demonstrates normal sinus rhythm, rate 72, no ST/T abnormalities  TTE 10/31/13: - Left ventricle: The cavity size was normal. Systolic function was  vigorous. The estimated ejection fraction was in the  range of 65%  to 70%. Wall motion was normal; there were no regional wall  motion abnormalities. Doppler parameters are consistent with  abnormal left ventricular relaxation (grade 1 diastolic  dysfunction). Doppler parameters are consistent with elevated  ventricular end-diastolic filling pressure.  - Aortic valve: Trileaflet; mildly thickened leaflets.  Transvalvular velocity was within the normal range. There was no  stenosis. There was mild regurgitation.  - Aortic root: The aortic root was normal in size.  - Mitral valve: Severe mitral annular calcification, predominantly  posterior.  - Left atrium: The atrium was mildly dilated.  - Right ventricle: Systolic function was normal.  - Right atrium: The atrium  was normal in size.  - Pulmonary arteries: Systolic pressure was within the normal  range.  - Pericardium, extracardiac: There was no pericardial effusion.   Impressions:   - Normal biventricular size and systolic function.  Abnormal relaxation with elevated filling pressures.  Mild aortic regurgitation.   Cath 12/30/15: 1. Moderate small vessel coronary artery disease involving the first diagonal and RCA subbranches. 2. Widely patent RCA, LAD, left main, and left circumflex vessels 3. Normal LVEDP 4. Heavily calcified mitral annulus  Recommendations: Medical therapy   Recent Labs: No results found for requested labs within last 8760 hours.  Recent Lipid Panel No results found for: CHOL, TRIG, HDL, CHOLHDL, VLDL, LDLCALC, LDLDIRECT  Physical Exam:    VS:  BP (!) 152/60   Pulse 79   Ht 5\' 3"  (1.6 m)   Wt 132 lb 3.2 oz (60 kg)   SpO2 99%   BMI 23.42 kg/m     Wt Readings from Last 3 Encounters:  07/24/19 132 lb 3.2 oz (60 kg)  07/05/19 132 lb (59.9 kg)  06/22/19 129 lb (58.5 kg)     GEN:   in no acute distress HEENT: Normal NECK: No JVD CARDIAC: RRR, no murmurs, rubs, gallops RESPIRATORY:  Clear to auscultation without rales,  wheezing or rhonchi  ABDOMEN: Soft, non-tender, non-distended MUSCULOSKELETAL:  No edema; No deformity  SKIN: Warm and dry NEUROLOGIC:  Alert and oriented x 3 PSYCHIATRIC:  Normal affect   ASSESSMENT:    1. Coronary artery disease involving native coronary artery of native heart, angina presence unspecified   2. Aortic valve insufficiency, etiology of cardiac valve disease unspecified   3. Hyperlipidemia, unspecified hyperlipidemia type   4. Essential hypertension    PLAN:     CAD: Cath on 12/30/2015 showed moderate small vessel CAD involving the first diagonal and RCA subbranches.  Normal LV systolic function on TTE in 2015.  Having shortness of breath with minimal exertion  TE on 06/29/2019 showed normal biventricular function, mild mitral stenosis, mild aortic regurgitation.  Lexiscan Myoview on 06/22/2019 showed normal perfusion. -Continue aspirin 81 mg daily. -Continue atorvastatin 80 mg daily  Aortic regurgitation: Mild on TTE 06/29/19.  Will monitor   Hyperlipidemia: On atorvastatin 80 mg daily.  LDL 50 on 09/26/18  Hypertension: On telmisartan 80 mg daily and amlodipine 5 mg daily.  BP elevated, will increase amlodipine to 10 mg daily.  Asked patient to check blood pressure daily for next 2 weeks and call with results.  Type 2 diabetes: On insulin.  A1c 7.6% on 05/17/2018  RTC in 6 months  Medication Adjustments/Labs and Tests Ordered: Current medicines are reviewed at length with the patient today.  Concerns regarding medicines are outlined above.  No orders of the defined types were placed in this encounter.  Meds ordered this encounter  Medications  . amLODipine (NORVASC) 10 MG tablet    Sig: Take 1 tablet (10 mg total) by mouth daily.    Dispense:  90 tablet    Refill:  3    Dose increase    Patient Instructions  Medication Instructions:  INCREASE amlodipine 10 mg daily  *If you need a refill on your cardiac medications before your next appointment, please call  your pharmacy*  Follow-Up: At [facility], you and your health needs are our priority.  As part of our continuing mission to provide you with exceptional heart care, we have created designated Provider Care Teams.  These Care Teams include your primary Cardiologist (physician) and Advanced  Practice Providers (APPs -  Physician Assistants and Nurse Practitioners) who all work together to provide you with the care you need, when you need it.  We recommend signing up for the patient portal called "MyChart".  Sign up information is provided on this After Visit Summary.  MyChart is used to connect with patients for Virtual Visits (Telemedicine).  Patients are able to view lab/test results, encounter notes, upcoming appointments, etc.  Non-urgent messages can be sent to your provider as well.   To learn more about what you can do with MyChart, go to [email or web address].    Your next appointment:   6 month(s)  The format for your next appointment:   In Person  Provider:   Epifanio Lesches, MD   Other Instructions Please check your blood pressure at home daily, write it down.  Call the office of send message via Mychart with the readings in 2 weeks for Dr. Bjorn Pippin to review.       Signed, Little Ishikawa, MD  07/24/2019 5:51 PM    [facility]

## 2019-07-24 NOTE — Patient Instructions (Signed)
Medication Instructions:  INCREASE amlodipine 10 mg daily  *If you need a refill on your cardiac medications before your next appointment, please call your pharmacy*  Follow-Up: At [facility], you and your health needs are our priority.  As part of our continuing mission to provide you with exceptional heart care, we have created designated Provider Care Teams.  These Care Teams include your primary Cardiologist (physician) and Advanced Practice Providers (APPs -  Physician Assistants and Nurse Practitioners) who all work together to provide you with the care you need, when you need it.  We recommend signing up for the patient portal called "MyChart".  Sign up information is provided on this After Visit Summary.  MyChart is used to connect with patients for Virtual Visits (Telemedicine).  Patients are able to view lab/test results, encounter notes, upcoming appointments, etc.  Non-urgent messages can be sent to your provider as well.   To learn more about what you can do with MyChart, go to [email or web address].    Your next appointment:   6 month(s)  The format for your next appointment:   In Person  Provider:   Epifanio Lesches, MD   Other Instructions Please check your blood pressure at home daily, write it down.  Call the office of send message via Mychart with the readings in 2 weeks for Dr. Bjorn Pippin to review.

## 2019-09-17 ENCOUNTER — Ambulatory Visit (INDEPENDENT_AMBULATORY_CARE_PROVIDER_SITE_OTHER): Payer: Medicare PPO | Admitting: Ophthalmology

## 2019-09-17 ENCOUNTER — Other Ambulatory Visit: Payer: Self-pay

## 2019-09-17 ENCOUNTER — Encounter (INDEPENDENT_AMBULATORY_CARE_PROVIDER_SITE_OTHER): Payer: Self-pay | Admitting: Ophthalmology

## 2019-09-17 DIAGNOSIS — E103393 Type 1 diabetes mellitus with moderate nonproliferative diabetic retinopathy without macular edema, bilateral: Secondary | ICD-10-CM | POA: Diagnosis not present

## 2019-09-17 DIAGNOSIS — H43813 Vitreous degeneration, bilateral: Secondary | ICD-10-CM

## 2019-09-17 NOTE — Progress Notes (Signed)
09/17/2019     CHIEF COMPLAINT Patient presents for Diabetic Eye Exam   HISTORY OF PRESENT ILLNESS: Abigail Green is a 74 y.o. female who presents to the clinic today for:   HPI    Diabetic Eye Exam    Vision is stable.  Associated Symptoms Flashes, Floaters and Distortion.  Diabetes characteristics include Type 2.  This started 6 months ago.  Blood sugar level is controlled.  Last Blood Glucose 185.  Last A1C 7.6.          Comments    6 month f/u OCT mac and dilated exam today.  Pt denies noticeable changes to [place] OU since last visit. Pt denies ocular pain, flashes of light, or floaters OU.  LBS: 185 this a.m and A1C was 7.6       Last edited by Melburn Popper, COA on 09/17/2019  9:06 AM. (History)      Referring physician: Reynold Bowen, MD [place],  [place] [id number]  HISTORICAL INFORMATION:   Selected notes from the [facility]: No current outpatient medications on file. (Ophthalmic Drugs)   No current facility-administered medications for this visit. (Ophthalmic Drugs)   Current Outpatient Medications (Other)  Medication Sig  . ACCU-CHEK GUIDE test strip   . Accu-Chek Softclix Lancets lancets   . amLODipine (NORVASC) 10 MG tablet Take 1 tablet (10 mg total) by mouth daily.  Marland Kitchen aspirin EC 81 MG tablet Take 81 mg by mouth daily.  Marland Kitchen atorvastatin (LIPITOR) 80 MG tablet Take 1 tablet by mouth every evening.  . Calcium Carbonate (CALCIUM 600 PO) Take 2 tablets by mouth in the morning and at bedtime.  . insulin aspart (NOVOLOG FLEXPEN) 100 UNIT/ML FlexPen Inject into the skin. 8-10 units at breakfast, 10-12 units at [facility] and dinner  . Insulin Glargine (LANTUS [place]) Inject 36 Units into the skin daily.   . Multiple Vitamin (MULTIVITAMIN) capsule Take 1 capsule by mouth daily.  . Multiple Vitamins-Minerals (ZINC PO) Take 1 tablet by mouth daily.  . naproxen sodium (ALEVE) 220 MG tablet Take 220 mg by mouth daily  as needed.  . Omega-3 Fatty Acids (FISH OIL) 1200 MG CAPS Take 1 capsule by mouth daily.  Marland Kitchen SYNTHROID 112 MCG tablet Take 112 mcg by mouth. Every morning before breakfast and 1/2 tablet on Wednesday and Sunday  . telmisartan (MICARDIS) 80 MG tablet Take 80 mg by mouth daily.  Marland Kitchen VITAMIN D PO Take 1 tablet by mouth daily.   No current facility-administered medications for this visit. (Other)      REVIEW OF SYSTEMS:    ALLERGIES Allergies  Allergen Reactions  . Penicillins Rash    Has patient had a PCN reaction causing immediate rash, facial/tongue/throat swelling, SOB or lightheadedness with hypotension:YES Has patient had a PCN reaction causing severe rash involving mucus membranes or skin necrosis:unsure. Has patient had a PCN reaction that required hospitalization:PT. WAS INPATIENT WHEN REACTION OCCURRED. Has patient had a PCN reaction occurring within the last 10 years:No If all of the above answers are "NO", then may proceed with Cephalosporin use.     PAST MEDICAL HISTORY Past Medical History:  Diagnosis Date  . Cataract    removed bilaterally  . Diabetes mellitus type 1 ([place]) 12/29/2015  . Essential hypertension 12/29/2015  . Family history of premature coronary artery disease 12/29/2015  . Hyperlipidemia 12/29/2015  . Hypoparathyroidism [facility])    Past Surgical History:  Procedure  Laterality Date  . BREAST CYST EXCISION Left   . CARDIAC CATHETERIZATION N/A 12/30/2015   Procedure: Left Heart Cath and Coronary Angiography;  Surgeon: Sherren Mocha, MD;  Location: [facility];  Service: Cardiovascular;  Laterality: N/A;  . CATARACT EXTRACTION, BILATERAL    . CESAREAN SECTION     x 2  . THYROIDECTOMY    . TRIGGER FINGER RELEASE Right    Thumb, A1 pully and incidental synovectomy of superficialis profundus tendons  . TRIGGER FINGER RELEASE Left     FAMILY HISTORY Family History  Problem Relation Age of Onset  . Heart failure Mother   . CAD Mother   .  COPD Sister   . Diabetes Brother   . Addison's disease Sister   . Colon cancer Neg Hx   . Esophageal cancer Neg Hx   . Rectal cancer Neg Hx   . Stomach cancer Neg Hx     SOCIAL HISTORY Social History   Tobacco Use  . Smoking status: Never Smoker  . Smokeless tobacco: Never Used  Vaping Use  . Vaping Use: Never used  Substance Use Topics  . Alcohol use: No  . Drug use: No         OPHTHALMIC EXAM:  Base Eye Exam    Visual Acuity (ETDRS)      Right Left   Dist [place] 20/60 -1 20/20 -2   Dist ph [place] 20/20 -2        Tonometry (Tonopen, 9:12 AM)      Right Left   Pressure 12 18       Pupils      Dark Light Shape React APD   Right 4 3 Round Sluggish None   Left 4 3 Round Sluggish None       Visual Fields (Counting fingers)      Left Right    Full Full       Extraocular Movement      Right Left    Full Full       Neuro/Psych    Oriented x3: Yes   Mood/Affect: Normal       Dilation    Both eyes: 1.0% Mydriacyl, 2.5% Phenylephrine @ 9:12 AM        Slit Lamp and Fundus Exam    External Exam      Right Left   External Normal Normal       Slit Lamp Exam      Right Left   Lids/Lashes Normal Normal   Conjunctiva/Sclera White and quiet White and quiet   Cornea Clear Clear   Anterior Chamber Deep and quiet Deep and quiet   Iris Round and reactive Round and reactive   Lens Posterior chamber intraocular lens Posterior chamber intraocular lens   Anterior Vitreous Normal Normal       Fundus Exam      Right Left   Posterior Vitreous Normal Normal   Disc Normal Normal   C/D Ratio 0.4 0.4   Macula no macular thickening, no clinically significant macular edema, Microaneurysms no macular thickening, no clinically significant macular edema, Microaneurysms   Vessels NPDR- Moderate NPDR- Moderate   Periphery Normal Normal          IMAGING AND PROCEDURES  Imaging and Procedures for 09/17/19  OCT, Retina - OU - Both Eyes       Right Eye Scan  locations included subfoveal. Central Foveal Thickness: 295. Progression has been stable. Findings include normal observations.   Left Eye  Central Foveal Thickness: 286. Progression has been stable.   Notes No active maculopathy in either eye.  Posterior vitreous detachment OU                ASSESSMENT/PLAN:  Diabetic visual loss: severe vision impairment of both eyes, without macular edema, with moderate nonproliferative retinopathy, associated with type 1 diabetes mellitus ([place]) The nature of moderate nonproliferative diabetic retinopathy was discussed with the patient as well as the need for more frequent follow up to judge for progression. Good blood glucose, blood pressure, and serum lipid control was recommended as well as avoidance of smoking and maintenance of normal weight.  Close follow up with PCP encouraged.      ICD-10-CM   1. Moderate nonproliferative diabetic retinopathy of both eyes without macular edema associated with type 1 diabetes mellitus (HCC)  E10.3393 OCT, Retina - OU - Both Eyes  2. Posterior vitreous detachment of both eyes  H43.813 OCT, Retina - OU - Both Eyes    1.  Follow-up with Dr. Forde Dandy as scheduled.  2.  With Dr. Carolynn Sayers and [facility] eye care as needed necessary as needed change in spectacle correction  3.  Ophthalmic Meds Ordered this visit:  No orders of the defined types were placed in this encounter.      Return in about 8 months (around 05/17/2020) for DILATE OU, COLOR FP.  There are no Patient Instructions on file for this visit.   Explained the diagnoses, plan, and follow up with the patient and they expressed understanding.  Patient expressed understanding of the importance of proper follow up care.   Clent Demark Che Below M.D. Diseases & Surgery of the Retina and Vitreous Retina & Diabetic Chambers 09/17/19     Abbreviations: M myopia (nearsighted); A astigmatism; H hyperopia (farsighted); P presbyopia; Mrx spectacle prescription;   CTL contact lenses; OD right eye; OS left eye; OU both eyes  XT exotropia; ET esotropia; PEK punctate epithelial keratitis; PEE punctate epithelial erosions; DES dry eye syndrome; MGD meibomian gland dysfunction; ATs artificial tears; PFAT's preservative free artificial tears; [employer] nuclear sclerotic cataract; PSC posterior subcapsular cataract; ERM epi-retinal membrane; PVD posterior vitreous detachment; RD retinal detachment; DM diabetes mellitus; DR diabetic retinopathy; NPDR non-proliferative diabetic retinopathy; PDR proliferative diabetic retinopathy; CSME clinically significant macular edema; DME diabetic macular edema; dbh dot blot hemorrhages; CWS cotton wool spot; POAG primary open angle glaucoma; C/D cup-to-disc ratio; HVF humphrey visual field; GVF goldmann visual field; OCT optical coherence tomography; IOP intraocular pressure; BRVO Branch retinal vein occlusion; CRVO central retinal vein occlusion; CRAO central retinal artery occlusion; BRAO branch retinal artery occlusion; RT retinal tear; SB scleral buckle; PPV pars plana vitrectomy; VH Vitreous hemorrhage; PRP panretinal laser photocoagulation; IVK intravitreal kenalog; VMT vitreomacular traction; MH Macular hole;  NVD neovascularization of the disc; NVE neovascularization elsewhere; AREDS age related eye disease study; ARMD age related macular degeneration; POAG primary open angle glaucoma; EBMD epithelial/anterior basement membrane dystrophy; ACIOL anterior chamber intraocular lens; IOL intraocular lens; PCIOL posterior chamber intraocular lens; Phaco/IOL phacoemulsification with intraocular lens placement; [place] photorefractive keratectomy; LASIK laser assisted in situ keratomileusis; HTN hypertension; DM diabetes mellitus; COPD chronic obstructive pulmonary disease

## 2019-09-17 NOTE — Assessment & Plan Note (Signed)
The nature of moderate nonproliferative diabetic retinopathy was discussed with the patient as well as the need for more frequent follow up to judge for progression. Good blood glucose, blood pressure, and serum lipid control was recommended as well as avoidance of smoking and maintenance of normal weight.  Close follow up with PCP encouraged. °

## 2020-01-21 NOTE — Progress Notes (Signed)
Cardiology Office Note:    Date:  01/22/2020   ID:  Abigail Green, DOB [birth year], MRN [id number]  PCP:  Adrian Prince, MD  Cardiologist:  Little Ishikawa, MD  Electrophysiologist:  None   Referring MD: Adrian Prince, MD   Chief Complaint  Patient presents with  . Coronary Artery Disease   History of Present Illness:    Abigail Green is a 74 y.o. female with a hx of type 1 diabetes, hypertension, hyperlipidemia who presents for follow-up.  She was referred by Dr. Evlyn Kanner for evaluation of coronary artery disease, initially seen on 06/20/2019.  She reported rare chest pain.   TTE on 10/31/2013 showed LVEF 65 to 70%, grade 1 diastolic dysfunction, mild AI, severe mitral annular calcification, normal RV function.  Cath on 12/30/2015 showed moderate small vessel CAD involving the first diagonal and RCA subbranches.  TTE on 06/29/2019 showed normal biventricular function, mild mitral stenosis, mild aortic regurgitation.  Lexiscan Myoview on 06/22/2019 showed normal perfusion.  Since last clinic visit, she reports that she is doing well.  She has been walking 5 days/week, usually for about 15 to 20 minutes.  She denies any chest pain.  Reports dyspnea has improved.  She denies any lightheadedness or syncope.  Does report intermittent swelling in the left lower extremity.  Denies any palpitations.  Reports has not been checking her BP recently.   Past Medical History:  Diagnosis Date  . Cataract    removed bilaterally  . Diabetes mellitus type 1 (HCC) 12/29/2015  . Essential hypertension 12/29/2015  . Family history of premature coronary artery disease 12/29/2015  . Hyperlipidemia 12/29/2015  . Hypoparathyroidism [facility])     Past Surgical History:  Procedure Laterality Date  . BREAST CYST EXCISION Left   . CARDIAC CATHETERIZATION N/A 12/30/2015   Procedure: Left Heart Cath and Coronary Angiography;  Surgeon: Tonny Bollman, MD;  Location: [facility] INVASIVE CV LAB;  Service:  Cardiovascular;  Laterality: N/A;  . CATARACT EXTRACTION, BILATERAL    . CESAREAN SECTION     x 2  . THYROIDECTOMY    . TRIGGER FINGER RELEASE Right    Thumb, A1 pully and incidental synovectomy of superficialis profundus tendons  . TRIGGER FINGER RELEASE Left     Current Medications: Current Meds  Medication Sig  . ACCU-CHEK GUIDE test strip   . Accu-Chek Softclix Lancets lancets   . amLODipine (NORVASC) 10 MG tablet Take 1 tablet (10 mg total) by mouth daily.  Marland Kitchen aspirin EC 81 MG tablet Take 81 mg by mouth daily.  Marland Kitchen atorvastatin (LIPITOR) 80 MG tablet Take 1 tablet by mouth every evening.  . Calcium Carbonate (CALCIUM 600 PO) Take 2 tablets by mouth in the morning and at bedtime.  . insulin aspart (NOVOLOG FLEXPEN) 100 UNIT/ML FlexPen Inject into the skin. 8-10 units at breakfast, 10-12 units at [facility] and dinner  . Insulin Glargine (LANTUS [place]) Inject 36 Units into the skin daily.   . Multiple Vitamin (MULTIVITAMIN) capsule Take 1 capsule by mouth daily.  . Multiple Vitamins-Minerals (ZINC PO) Take 1 tablet by mouth daily.  . naproxen sodium (ALEVE) 220 MG tablet Take 220 mg by mouth daily as needed.  . Omega-3 Fatty Acids (FISH OIL) 1200 MG CAPS Take 1 capsule by mouth daily.  Marland Kitchen SYNTHROID 112 MCG tablet Take 112 mcg by mouth. Every morning before breakfast and 1/2 tablet on Wednesday and Sunday  . telmisartan (MICARDIS) 80 MG tablet Take 80 mg by mouth daily.  Marland Kitchen VITAMIN  D PO Take 1 tablet by mouth daily.     Allergies:   Penicillins   Social History   Socioeconomic History  . Marital status: Married    Spouse name: Not on file  . Number of children: 2  . Years of education: Not on file  . Highest education level: Not on file  Occupational History  . Occupation: retired   Tobacco Use  . Smoking status: Never Smoker  . Smokeless tobacco: Never Used  Vaping Use  . Vaping Use: Never used  Substance and Sexual Activity  . Alcohol use: No  . Drug use: No  . Sexual  activity: Not on file  Other Topics Concern  . Not on file  Social History Narrative  . Not on file   Social Determinants of Health   Financial Resource Strain:   . Difficulty of Paying Living Expenses: Not on file  Food Insecurity:   . Worried About Programme researcher, broadcasting/film/video in the Last Year: Not on file  . Ran Out of Food in the Last Year: Not on file  Transportation Needs:   . Lack of Transportation (Medical): Not on file  . Lack of Transportation (Non-Medical): Not on file  Physical Activity:   . Days of Exercise per Week: Not on file  . Minutes of Exercise per Session: Not on file  Stress:   . Feeling of Stress : Not on file  Social Connections:   . Frequency of Communication with Friends and Family: Not on file  . Frequency of Social Gatherings with Friends and Family: Not on file  . Attends Religious Services: Not on file  . Active Member of Clubs or Organizations: Not on file  . Attends Banker Meetings: Not on file  . Marital Status: Not on file     Family History: The patient's family history includes Addison's disease in her sister; CAD in her mother; COPD in her sister; Diabetes in her brother; Heart failure in her mother. There is no history of Colon cancer, Esophageal cancer, Rectal cancer, or Stomach cancer.  ROS:   Please see the history of present illness.     All other systems reviewed and are negative.  EKGs/Labs/Other Studies Reviewed:    The following studies were reviewed today:   EKG:  EKG is  ordered today.  The ekg ordered today demonstrates normal sinus rhythm, rate 76, nonspecific T wave flattening  TTE 10/31/13: - Left ventricle: The cavity size was normal. Systolic function was  vigorous. The estimated ejection fraction was in the range of 65%  to 70%. Wall motion was normal; there were no regional wall  motion abnormalities. Doppler parameters are consistent with  abnormal left ventricular relaxation (grade 1 diastolic    dysfunction). Doppler parameters are consistent with elevated  ventricular end-diastolic filling pressure.  - Aortic valve: Trileaflet; mildly thickened leaflets.  Transvalvular velocity was within the normal range. There was no  stenosis. There was mild regurgitation.  - Aortic root: The aortic root was normal in size.  - Mitral valve: Severe mitral annular calcification, predominantly  posterior.  - Left atrium: The atrium was mildly dilated.  - Right ventricle: Systolic function was normal.  - Right atrium: The atrium was normal in size.  - Pulmonary arteries: Systolic pressure was within the normal  range.  - Pericardium, extracardiac: There was no pericardial effusion.   Impressions:   - Normal biventricular size and systolic function.  Abnormal relaxation with elevated filling pressures.  Mild  aortic regurgitation.   Cath 12/30/15: 1. Moderate small vessel coronary artery disease involving the first diagonal and RCA subbranches. 2. Widely patent RCA, LAD, left main, and left circumflex vessels 3. Normal LVEDP 4. Heavily calcified mitral annulus  Recommendations: Medical therapy   Recent Labs: No results found for requested labs within last 8760 hours.  Recent Lipid Panel No results found for: CHOL, TRIG, HDL, CHOLHDL, VLDL, LDLCALC, LDLDIRECT  Physical Exam:    VS:  BP (!) 144/58   Pulse 76   Ht 5\' 3"  (1.6 m)   Wt 127 lb 9.6 oz (57.9 kg)   BMI 22.60 kg/m     Wt Readings from Last 3 Encounters:  01/22/20 127 lb 9.6 oz (57.9 kg)  07/24/19 132 lb 3.2 oz (60 kg)  07/05/19 132 lb (59.9 kg)     GEN:   in no acute distress HEENT: Normal NECK: No JVD CARDIAC: RRR, no murmurs, rubs, gallops RESPIRATORY:  Clear to auscultation without rales, wheezing or rhonchi  ABDOMEN: Soft, non-tender, non-distended MUSCULOSKELETAL:  No edema; No deformity  SKIN: Warm and dry NEUROLOGIC:  Alert and oriented x 3 PSYCHIATRIC:  Normal affect   ASSESSMENT:     1. CAD in native artery   2. Aortic valve insufficiency, etiology of cardiac valve disease unspecified   3. Hyperlipidemia, unspecified hyperlipidemia type   4. Essential hypertension    PLAN:    CAD: Cath on 12/30/2015 showed moderate small vessel CAD involving the first diagonal and RCA subbranches.  Normal LV systolic function on TTE in 2015.  Having shortness of breath with minimal exertion  TTE on 06/29/2019 showed normal biventricular function, mild mitral stenosis, mild aortic regurgitation.  Lexiscan Myoview on 06/22/2019 showed normal perfusion. -Continue aspirin 81 mg daily. -Continue atorvastatin 80 mg daily  Aortic regurgitation: Mild on TTE 06/29/19.  Will monitor   Hyperlipidemia: On atorvastatin 80 mg daily.  LDL 50 on 09/12/19  Hypertension: On telmisartan 80 mg daily and amlodipine 10 mg daily.  BP mildly, asked patient to check BP daily for next week and call with results.  Type 2 diabetes: On insulin.  A1c 7.6% on 09/12/19  RTC in 1 year  Medication Adjustments/Labs and Tests Ordered: Current medicines are reviewed at length with the patient today.  Concerns regarding medicines are outlined above.  Orders Placed This Encounter  Procedures  . EKG 12-Lead   No orders of the defined types were placed in this encounter.   Patient Instructions  Medication Instructions:  Your physician recommends that you continue on your current medications as directed. Please refer to the Current Medication list given to you today.  *If you need a refill on your cardiac medications before your next appointment, please call your pharmacy*  Follow-Up: At [facility], you and your health needs are our priority.  As part of our continuing mission to provide you with exceptional heart care, we have created designated Provider Care Teams.  These Care Teams include your primary Cardiologist (physician) and Advanced Practice Providers (APPs -  Physician Assistants and Nurse Practitioners)  who all work together to provide you with the care you need, when you need it.  We recommend signing up for the patient portal called "MyChart".  Sign up information is provided on this After Visit Summary.  MyChart is used to connect with patients for Virtual Visits (Telemedicine).  Patients are able to view lab/test results, encounter notes, upcoming appointments, etc.  Non-urgent messages can be sent to your provider as well.  To learn more about what you can do with MyChart, go to [email or web address].    Your next appointment:   12 month(s)  The format for your next appointment:   In Person  Provider:   Epifanio Lescheshristopher Allana Shrestha, MD   Other Instructions Please check your blood pressure at home daily, write it down.  Call the office or send message via Mychart with the readings in 1 week for Dr. Bjorn PippinSchumann to review.       Signed, Little Ishikawahristopher L Atiyana Welte, MD  01/22/2020 1:39 PM    [facility]

## 2020-01-22 ENCOUNTER — Other Ambulatory Visit: Payer: Self-pay

## 2020-01-22 ENCOUNTER — Encounter: Payer: Self-pay | Admitting: Cardiology

## 2020-01-22 ENCOUNTER — Ambulatory Visit: Payer: Medicare PPO | Admitting: Cardiology

## 2020-01-22 VITALS — BP 144/58 | HR 76 | Ht 63.0 in | Wt 127.6 lb

## 2020-01-22 DIAGNOSIS — I2510 Atherosclerotic heart disease of native coronary artery without angina pectoris: Secondary | ICD-10-CM | POA: Diagnosis not present

## 2020-01-22 DIAGNOSIS — E785 Hyperlipidemia, unspecified: Secondary | ICD-10-CM

## 2020-01-22 DIAGNOSIS — I351 Nonrheumatic aortic (valve) insufficiency: Secondary | ICD-10-CM

## 2020-01-22 DIAGNOSIS — I10 Essential (primary) hypertension: Secondary | ICD-10-CM | POA: Diagnosis not present

## 2020-01-22 NOTE — Patient Instructions (Signed)
Medication Instructions:  Your physician recommends that you continue on your current medications as directed. Please refer to the Current Medication list given to you today.  *If you need a refill on your cardiac medications before your next appointment, please call your pharmacy*  Follow-Up: At [facility], you and your health needs are our priority.  As part of our continuing mission to provide you with exceptional heart care, we have created designated Provider Care Teams.  These Care Teams include your primary Cardiologist (physician) and Advanced Practice Providers (APPs -  Physician Assistants and Nurse Practitioners) who all work together to provide you with the care you need, when you need it.  We recommend signing up for the patient portal called "MyChart".  Sign up information is provided on this After Visit Summary.  MyChart is used to connect with patients for Virtual Visits (Telemedicine).  Patients are able to view lab/test results, encounter notes, upcoming appointments, etc.  Non-urgent messages can be sent to your provider as well.   To learn more about what you can do with MyChart, go to [email or web address].    Your next appointment:   12 month(s)  The format for your next appointment:   In Person  Provider:   Epifanio Lesches, MD   Other Instructions Please check your blood pressure at home daily, write it down.  Call the office or send message via Mychart with the readings in 1 week for Dr. Bjorn Pippin to review.

## 2020-02-06 NOTE — Telephone Encounter (Signed)
BP looks OK, no changes at this time

## 2020-05-19 ENCOUNTER — Other Ambulatory Visit: Payer: Self-pay

## 2020-05-19 ENCOUNTER — Encounter (INDEPENDENT_AMBULATORY_CARE_PROVIDER_SITE_OTHER): Payer: Self-pay | Admitting: Ophthalmology

## 2020-05-19 ENCOUNTER — Ambulatory Visit (INDEPENDENT_AMBULATORY_CARE_PROVIDER_SITE_OTHER): Payer: Medicare PPO | Admitting: Ophthalmology

## 2020-05-19 DIAGNOSIS — H43813 Vitreous degeneration, bilateral: Secondary | ICD-10-CM

## 2020-05-19 DIAGNOSIS — E103393 Type 1 diabetes mellitus with moderate nonproliferative diabetic retinopathy without macular edema, bilateral: Secondary | ICD-10-CM | POA: Diagnosis not present

## 2020-05-19 NOTE — Progress Notes (Signed)
05/19/2020     CHIEF COMPLAINT Patient presents for Retina Follow Up (8 Mo F/U OU//Pt denies noticeable changes to [place] OU since last visit. Pt denies ocular pain, flashes of light, or floaters OU. //A1c: 7.6, 4 mo ago/LBS: 137 this AM)   HISTORY OF PRESENT ILLNESS: Abigail Green is a 75 y.o. female who presents to the clinic today for:   HPI    Retina Follow Up    Patient presents with  Diabetic Retinopathy.  In both eyes.  This started 8 months ago.  Severity is mild.  Duration of 8 months.  Since onset it is stable. Additional comments: 8 Mo F/U OU  Pt denies noticeable changes to [place] OU since last visit. Pt denies ocular pain, flashes of light, or floaters OU.   A1c: 7.6, 4 mo ago LBS: 137 this AM       Last edited by Ileana Roup, COA on 05/19/2020  9:01 AM. (History)      Referring physician: Adrian Prince, MD [address] [place],  [place] [address]  HISTORICAL INFORMATION:   Selected notes from the MEDICAL RECORD NUMBER       CURRENT MEDICATIONS: No current outpatient medications on file. (Ophthalmic Drugs)   No current facility-administered medications for this visit. (Ophthalmic Drugs)   Current Outpatient Medications (Other)  Medication Sig  . ACCU-CHEK GUIDE test strip   . Accu-Chek Softclix Lancets lancets   . amLODipine (NORVASC) 10 MG tablet Take 1 tablet (10 mg total) by mouth daily.  Marland Kitchen aspirin EC 81 MG tablet Take 81 mg by mouth daily.  Marland Kitchen atorvastatin (LIPITOR) 80 MG tablet Take 1 tablet by mouth every evening.  . Calcium Carbonate (CALCIUM 600 PO) Take 2 tablets by mouth in the morning and at bedtime.  . insulin aspart (NOVOLOG FLEXPEN) 100 UNIT/ML FlexPen Inject into the skin. 8-10 units at breakfast, 10-12 units at [facility] and dinner  . Insulin Glargine (LANTUS ) Inject 36 Units into the skin daily.   . Multiple Vitamin (MULTIVITAMIN) capsule Take 1 capsule by mouth daily.  . Multiple Vitamins-Minerals (ZINC PO) Take 1 tablet by mouth daily.   . naproxen sodium (ALEVE) 220 MG tablet Take 220 mg by mouth daily as needed.  . Omega-3 Fatty Acids (FISH OIL) 1200 MG CAPS Take 1 capsule by mouth daily.  Marland Kitchen SYNTHROID 112 MCG tablet Take 112 mcg by mouth. Every morning before breakfast and 1/2 tablet on Wednesday and Sunday  . telmisartan (MICARDIS) 80 MG tablet Take 80 mg by mouth daily.  Marland Kitchen VITAMIN D PO Take 1 tablet by mouth daily.   No current facility-administered medications for this visit. (Other)      REVIEW OF SYSTEMS:    ALLERGIES Allergies  Allergen Reactions  . Penicillins Rash    Has patient had a PCN reaction causing immediate rash, facial/tongue/throat swelling, SOB or lightheadedness with hypotension:YES Has patient had a PCN reaction causing severe rash involving mucus membranes or skin necrosis:unsure. Has patient had a PCN reaction that required hospitalization:PT. WAS INPATIENT WHEN REACTION OCCURRED. Has patient had a PCN reaction occurring within the last 10 years:No If all of the above answers are "NO", then may proceed with Cephalosporin use.     PAST MEDICAL HISTORY Past Medical History:  Diagnosis Date  . Cataract    removed bilaterally  . Diabetes mellitus type 1 (HCC) 12/29/2015  . Essential hypertension 12/29/2015  . Family history of premature coronary artery disease 12/29/2015  . Hyperlipidemia 12/29/2015  .  Hypoparathyroidism [facility] [place] [facility])    Past Surgical History:  Procedure Laterality Date  . BREAST CYST EXCISION Left   . CARDIAC CATHETERIZATION N/A 12/30/2015   Procedure: Left Heart Cath and Coronary Angiography;  Surgeon: Tonny Bollman, MD;  Location: [facility] INVASIVE CV LAB;  Service: Cardiovascular;  Laterality: N/A;  . CATARACT EXTRACTION, BILATERAL    . CESAREAN SECTION     x 2  . THYROIDECTOMY    . TRIGGER FINGER RELEASE Right    Thumb, A1 pully and incidental synovectomy of superficialis profundus tendons  . TRIGGER FINGER RELEASE Left     FAMILY HISTORY Family History  Problem  Relation Age of Onset  . Heart failure Mother   . CAD Mother   . COPD Sister   . Diabetes Brother   . Addison's disease Sister   . Colon cancer Neg Hx   . Esophageal cancer Neg Hx   . Rectal cancer Neg Hx   . Stomach cancer Neg Hx     SOCIAL HISTORY Social History   Tobacco Use  . Smoking status: Never Smoker  . Smokeless tobacco: Never Used  Vaping Use  . Vaping Use: Never used  Substance Use Topics  . Alcohol use: No  . Drug use: No         OPHTHALMIC EXAM:  Base Eye Exam    Visual Acuity (ETDRS)      Right Left   Dist [place] 20/60 +1 20/20   Dist ph [place] 20/25 +1        Tonometry (Tonopen, 9:02 AM)      Right Left   Pressure 16 17       Pupils      Pupils Dark Light Shape React APD   Right PERRL 4 3 Round Slow None   Left PERRL 4 3 Round Slow None       Visual Fields (Counting fingers)      Left Right    Full Full       Extraocular Movement      Right Left    Full Full       Neuro/Psych    Oriented x3: Yes   Mood/Affect: Normal       Dilation    Both eyes: 1.0% Mydriacyl, 2.5% Phenylephrine @ 9:05 AM        Slit Lamp and Fundus Exam    External Exam      Right Left   External Normal Normal       Slit Lamp Exam      Right Left   Lids/Lashes Normal Normal   Conjunctiva/Sclera White and quiet White and quiet   Cornea Clear Clear   Anterior Chamber Deep and quiet Deep and quiet   Iris Round and reactive Round and reactive   Lens Posterior chamber intraocular lens Posterior chamber intraocular lens   Anterior Vitreous Normal Normal       Fundus Exam      Right Left   Posterior Vitreous Normal Normal   Disc Normal Normal   C/D Ratio 0.4 0.4   Macula no macular thickening, no clinically significant macular edema, Microaneurysms no macular thickening, no clinically significant macular edema, Microaneurysms   Vessels NPDR- Moderate NPDR- Moderate   Periphery Normal Normal          IMAGING AND PROCEDURES  Imaging and Procedures for  05/19/20  OCT, Retina - OU - Both Eyes       Right Eye Quality was good. Scan locations included subfoveal. Central  Foveal Thickness: 287. Findings include normal foveal contour.   Left Eye Quality was good. Scan locations included subfoveal. Central Foveal Thickness: 283. Progression has been stable. Findings include normal foveal contour.   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 Good blood glucose, blood pressure, and serum lipid control was recommended as well as avoidance of smoking and maintenance of normal weight.  Close follow up with PCP encouraged.  Careful examination of the retinal periphery, no signs of retinal capillary nonperfusion particularly in the temporal portions of the watershed areas of the retina.  We will continue to observe.  I did discuss with the patient the use of Optos wide-field angiography which may be needed in the future to delineate the baseline findings of her 54 years of diabetes mellitus.  I do not see any indication for need for this screening test at this time  Posterior vitreous detachment of both eyes Minor, physiologic no peripheral retinal holes or tears      ICD-10-CM   1. 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 Posterior vitreous detachment of both eyes  H43.813     1.  Patient continues to do very well, with no signs of peripheral retinopathy of an extensive degree.  2.  3.  Ophthalmic Meds Ordered this visit:  No orders of the defined types were placed in this encounter.      Return in about 8 months (around  01/19/2021) for DILATE OU, COLOR FP, OCT.  There are no Patient Instructions on file for this visit.   Explained the diagnoses, plan, and follow up with the patient and they expressed understanding.  Patient expressed understanding of the importance of proper follow up care.   Alford Highland Rankin M.D. Diseases & Surgery of the Retina and Vitreous [facility] 05/19/20     Abbreviations: M myopia (nearsighted); A astigmatism; H hyperopia (farsighted); P presbyopia; Mrx spectacle prescription;  CTL contact lenses; OD right eye; OS left eye; OU both eyes  XT exotropia; ET esotropia; PEK punctate epithelial keratitis; PEE punctate epithelial erosions; DES dry eye syndrome; MGD meibomian gland dysfunction; ATs artificial tears; PFAT's preservative free artificial tears; NSC nuclear sclerotic cataract; PSC posterior subcapsular cataract; ERM epi-retinal membrane; PVD posterior vitreous detachment; RD retinal detachment; DM diabetes mellitus; DR diabetic retinopathy; NPDR non-proliferative diabetic retinopathy; PDR proliferative diabetic retinopathy; CSME clinically significant macular edema; DME diabetic macular edema; dbh dot blot hemorrhages; CWS cotton wool spot; POAG primary open angle glaucoma; C/D cup-to-disc ratio; HVF humphrey visual field; GVF goldmann visual field; OCT optical coherence tomography; IOP intraocular pressure; BRVO Branch retinal vein occlusion; CRVO central retinal vein occlusion; CRAO central retinal artery occlusion; BRAO branch retinal artery occlusion; RT retinal tear; SB scleral buckle; PPV pars plana vitrectomy; VH Vitreous hemorrhage; PRP panretinal laser photocoagulation; IVK intravitreal kenalog; VMT vitreomacular traction; MH Macular hole;  NVD neovascularization of the disc; NVE neovascularization elsewhere; AREDS age related eye disease study; ARMD age related macular degeneration; POAG primary open angle glaucoma; EBMD epithelial/anterior basement membrane  dystrophy; ACIOL anterior chamber intraocular lens; IOL  intraocular lens; PCIOL posterior chamber intraocular lens; Phaco/IOL phacoemulsification with intraocular lens placement; [place] photorefractive keratectomy; LASIK laser assisted in situ keratomileusis; HTN hypertension; DM diabetes mellitus; COPD chronic obstructive pulmonary disease

## 2020-05-19 NOTE — Assessment & Plan Note (Signed)
Minor, physiologic no peripheral retinal holes or tears

## 2020-05-19 NOTE — Assessment & Plan Note (Signed)
The nature of moderate nonproliferative diabetic retinopathy was discussed with the patient as well as the need for more frequent follow up to judge for progression. Good blood glucose, blood pressure, and serum lipid control was recommended as well as avoidance of smoking and maintenance of normal weight.  Close follow up with PCP encouraged.  Careful examination of the retinal periphery, no signs of retinal capillary nonperfusion particularly in the temporal portions of the watershed areas of the retina.  We will continue to observe.  I did discuss with the patient the use of Optos wide-field angiography which may be needed in the future to delineate the baseline findings of her 54 years of diabetes mellitus.  I do not see any indication for need for this screening test at this time

## 2020-08-14 ENCOUNTER — Other Ambulatory Visit: Payer: Self-pay | Admitting: Cardiology

## 2020-08-14 NOTE — Telephone Encounter (Signed)
This is Dr. Schumann's pt 

## 2020-10-29 ENCOUNTER — Ambulatory Visit: Payer: Medicare PPO | Admitting: Cardiology

## 2020-12-21 IMAGING — MG DIGITAL DIAGNOSTIC UNILATERAL RIGHT MAMMOGRAM
4 series · 4 of 4 positions shown · non-contrast
Comparison: Previous exam(s).

CLINICAL DATA: Screening recall for right breast calcifications.

EXAM:
DIGITAL DIAGNOSTIC RIGHT MAMMOGRAM WITH CAD

[R ML (1 of 3)]
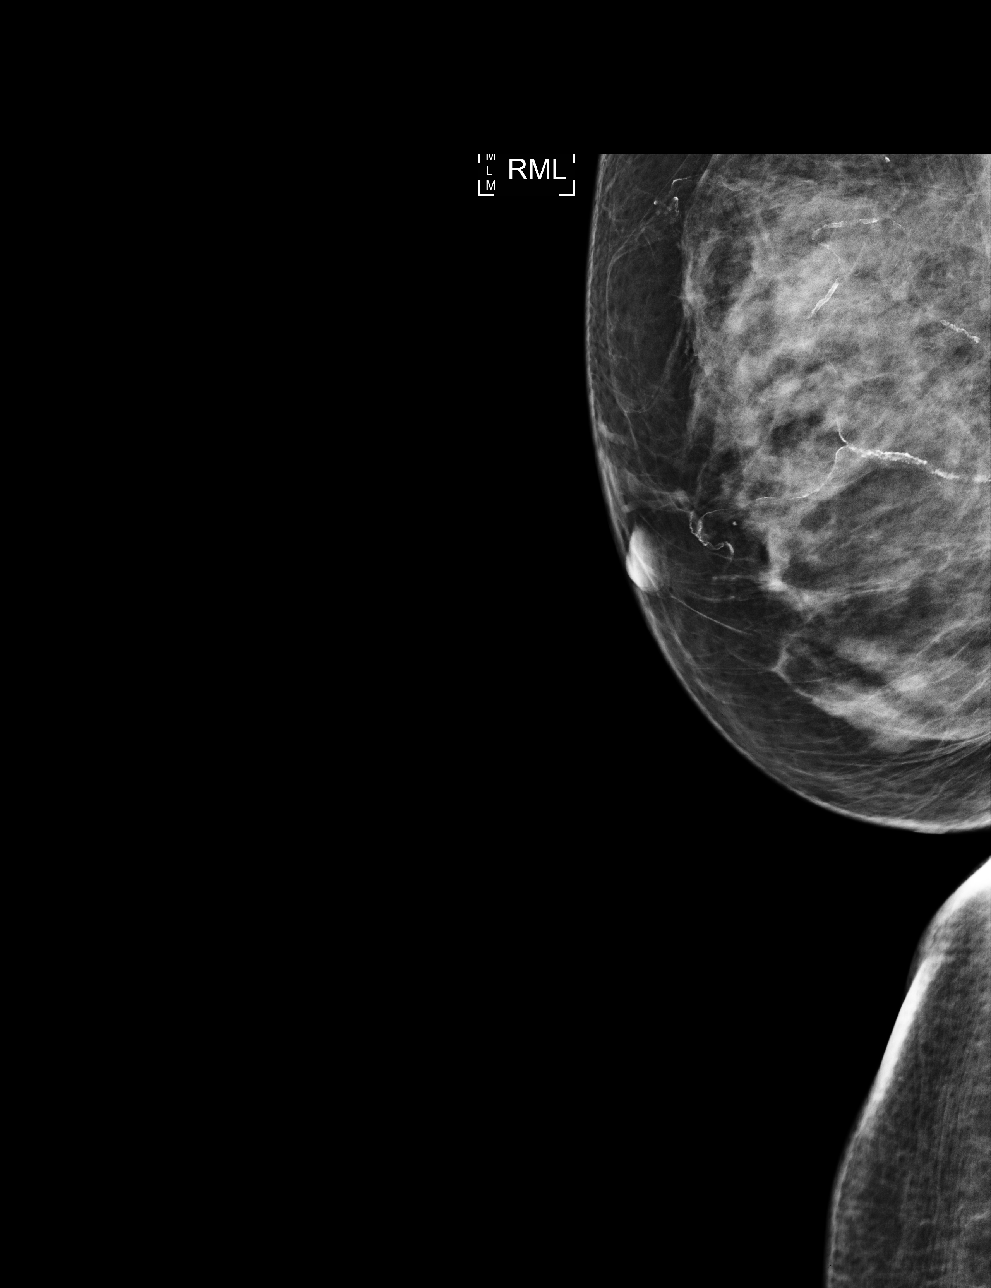

[R ML (2 of 3)]
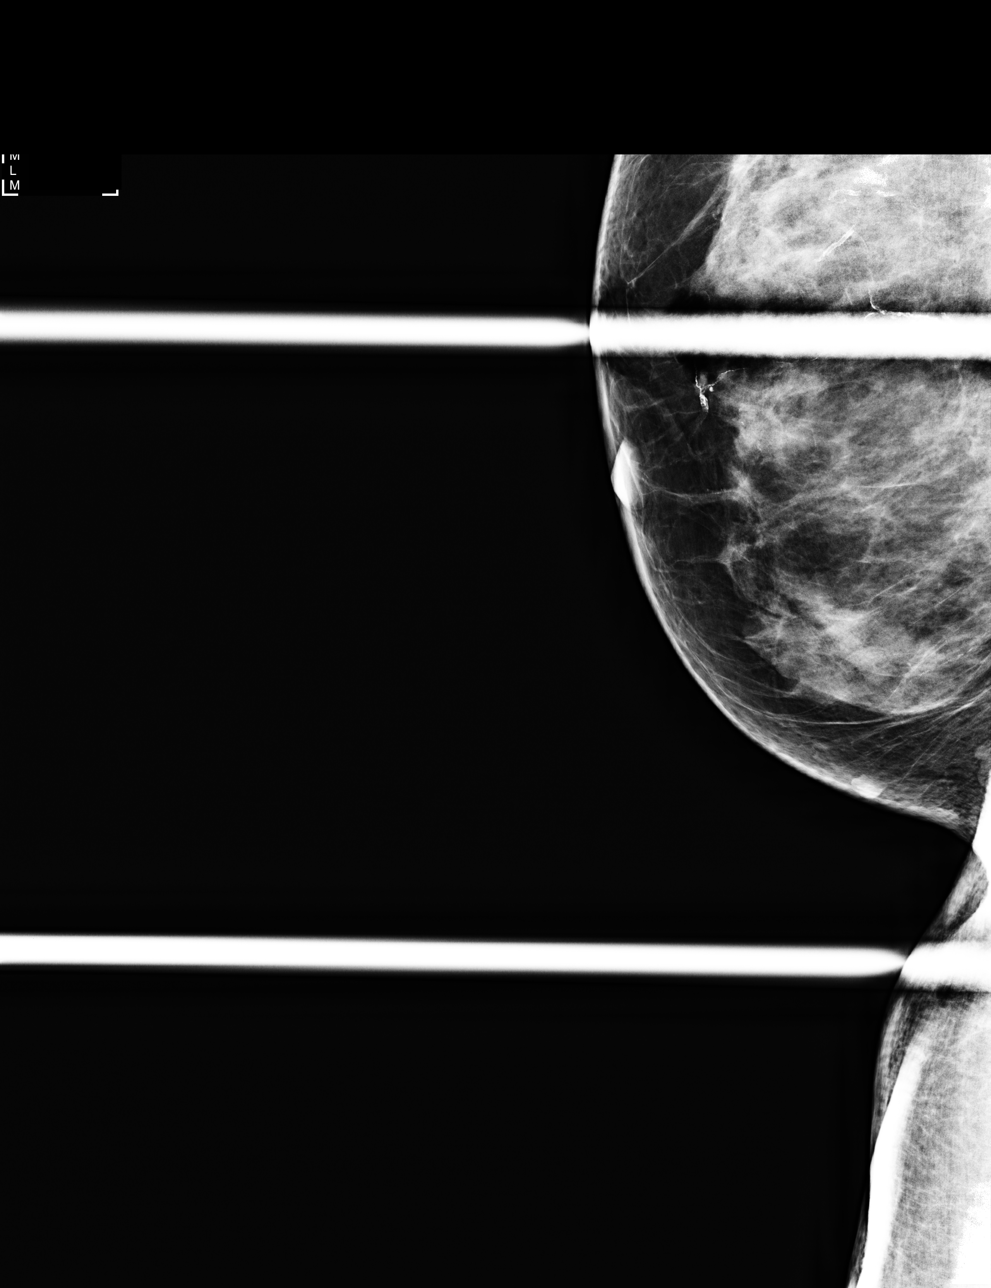

[R ML (3 of 3)]
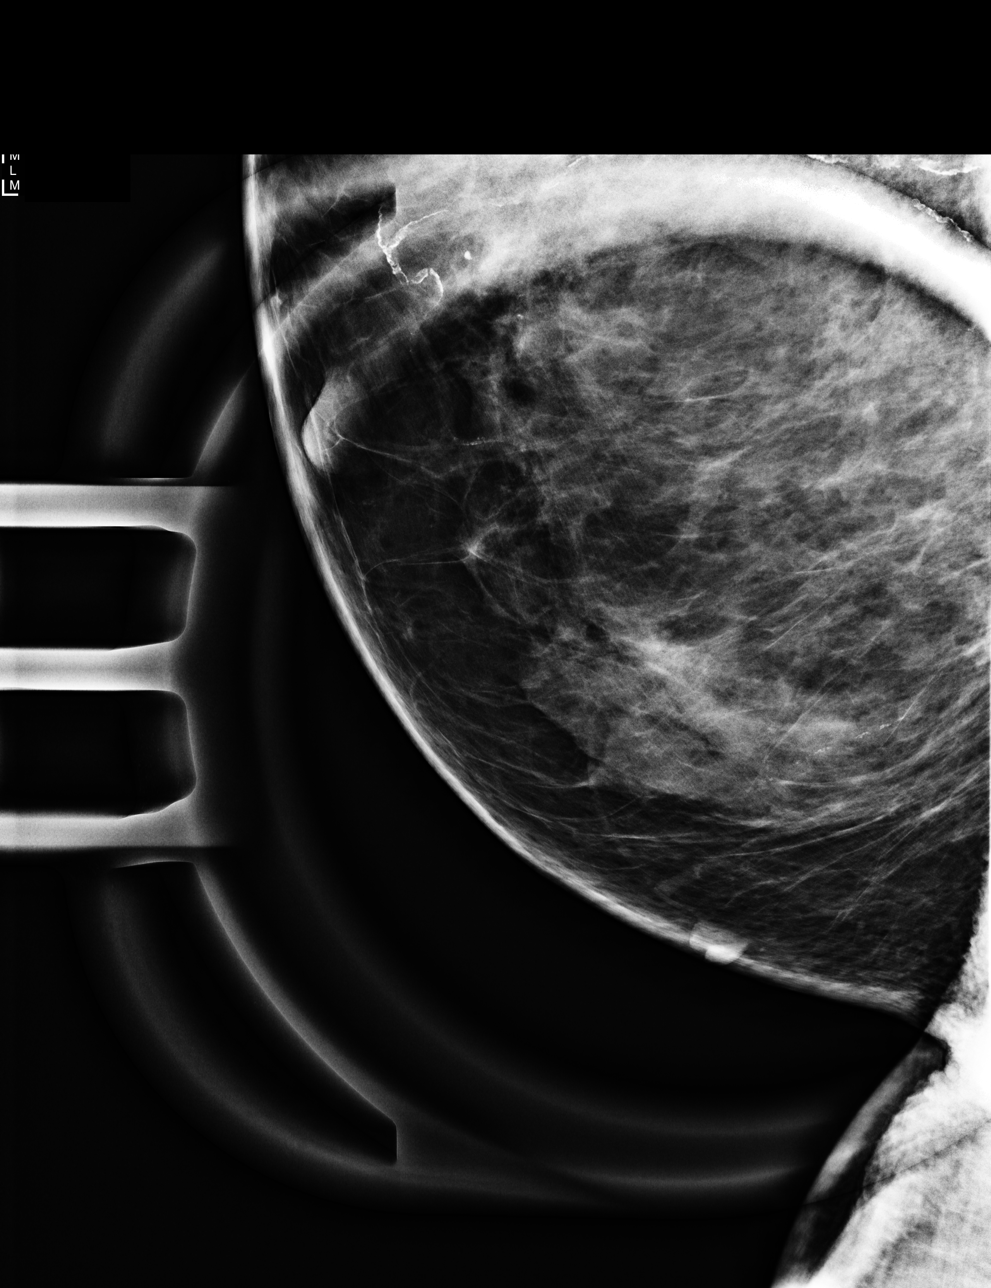

[R CC]
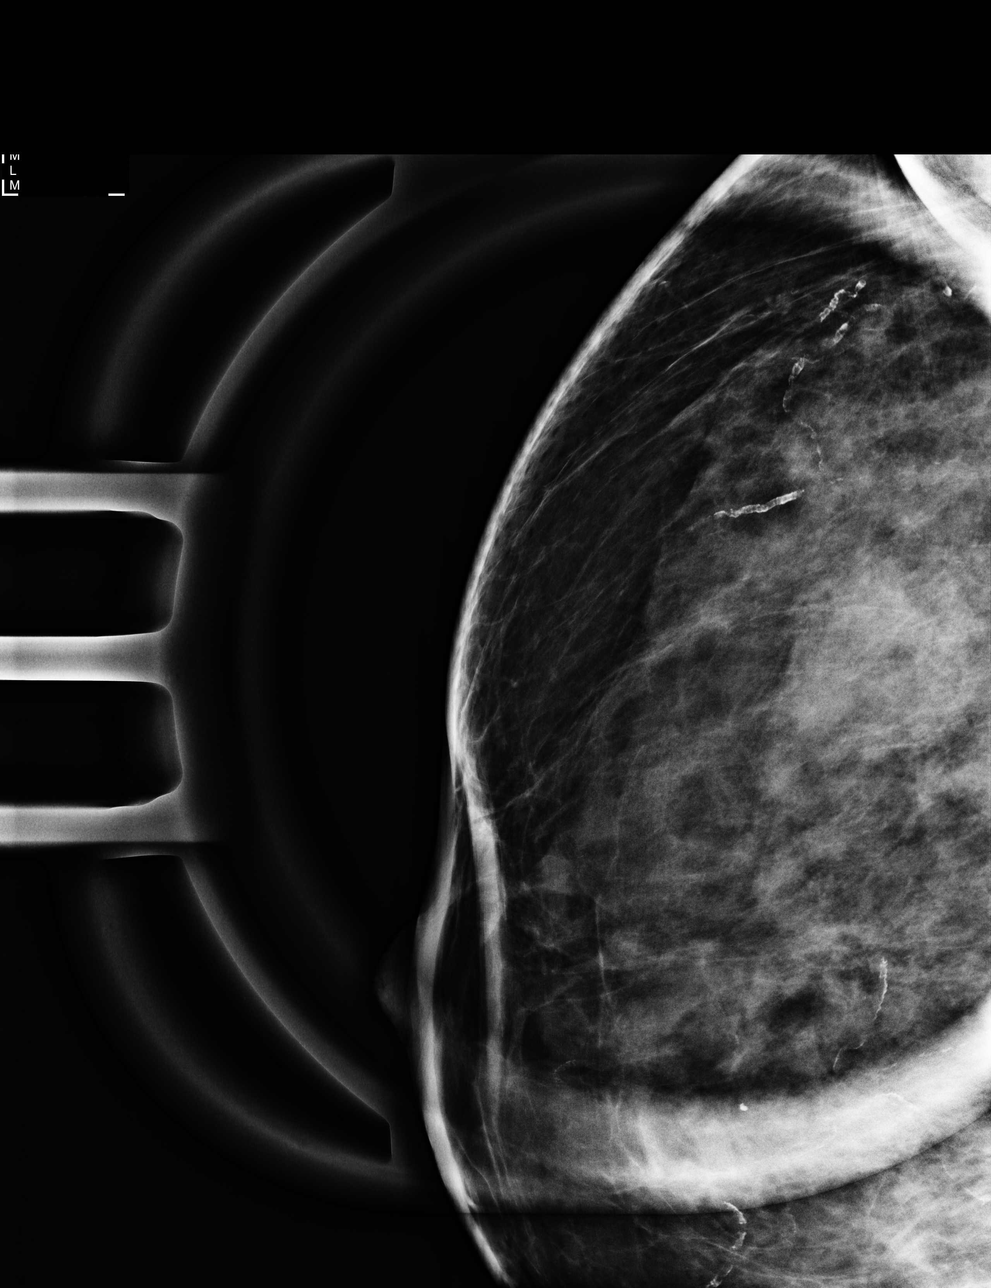

[4 of 4 positions shown; findings below may reference images not displayed]

ACR Breast Density Category c: The breast tissue is heterogeneously
dense, which may obscure small masses.
FINDINGS: The calcifications in the lateral right breast are shown to branch
directly off of a larger calcified blood vessel on the spot
compression magnification images. No suspicious calcifications,
masses or areas of distortion are seen in the right breast on these
diagnostic images.

Mammographic images were processed with CAD.
IMPRESSION: The right breast calcifications are vascular, and therefore benign.

RECOMMENDATION:
Screening mammogram in one year.(Code:[id number])

I have discussed the findings and recommendations with the patient.
Results were also provided in writing at the conclusion of the
visit. If applicable, a reminder letter will be sent to the patient
regarding the next appointment.

BI-RADS CATEGORY  2: Benign.

## 2021-01-18 NOTE — Progress Notes (Signed)
Cardiology Office Note:    Date:  01/20/2021   ID:  Abigail Green, DOB 01/25/1946, MRN [id number]  PCP:  Adrian PrinceSouth, Stephen, MD  Cardiologist:  [place] Ishikawahristopher L Meckenzie Balsley, MD  Electrophysiologist:  None   Referring MD: Adrian PrinceSouth, Stephen, MD   Chief Complaint  Patient presents with   Coronary Artery Disease    History of Present Illness:    Abigail Green is a 75 y.o. female with a hx of type 1 diabetes, hypertension, hyperlipidemia who presents for follow-up.  She was referred by Dr. Evlyn KannerSouth for evaluation of coronary artery disease, initially seen on 06/20/2019.  She reported rare chest pain.   TTE on 10/31/2013 showed LVEF 65 to 70%, grade 1 diastolic dysfunction, mild AI, severe mitral annular calcification, normal RV function.  Cath on 12/30/2015 showed moderate small vessel CAD involving the first diagonal and RCA subbranches.  TTE on 06/29/2019 showed normal biventricular function, mild mitral stenosis, mild aortic regurgitation.  Lexiscan Myoview on 06/22/2019 showed normal perfusion.  Since last clinic visit, she reports she has been doing well.  She had COVID 19 infection 3 weeks ago, no complications.  Did have some wheezing and coughing but resolved.  Denies any chest pain, dyspnea, lightheadedness, syncope, or palpitations.  Reports occasional swelling in legs.  She walks 20 minutes 5 days/week.  Home BP log has been 100s to 140s over 50s to 70s.    Past Medical History:  Diagnosis Date   Cataract    removed bilaterally   Diabetes mellitus type 1 (HCC) 12/29/2015   Essential hypertension 12/29/2015   Family history of premature coronary artery disease 12/29/2015   Hyperlipidemia 12/29/2015   Hypoparathyroidism (HCC)     Past Surgical History:  Procedure Laterality Date   BREAST CYST EXCISION Left    CARDIAC CATHETERIZATION N/A 12/30/2015   Procedure: Left Heart Cath and Coronary Angiography;  Surgeon: Tonny BollmanMichael Cooper, MD;  [facility];  Service:  Cardiovascular;  Laterality: N/A;   CATARACT EXTRACTION, BILATERAL     CESAREAN SECTION     x 2   THYROIDECTOMY     TRIGGER FINGER RELEASE Right    Thumb, A1 pully and incidental synovectomy of superficialis profundus tendons   TRIGGER FINGER RELEASE Left     Current Medications: Current Meds  Medication Sig   ACCU-CHEK GUIDE test strip    Accu-Chek Softclix Lancets lancets    amLODipine (NORVASC) 10 MG tablet TAKE 1 TABLET(10 MG) BY MOUTH DAILY   aspirin EC 81 MG tablet Take 81 mg by mouth daily.   atorvastatin (LIPITOR) 80 MG tablet Take 1 tablet by mouth every evening.   Calcium Carbonate (CALCIUM 600 PO) Take 2 tablets by mouth in the morning and at bedtime.   insulin aspart (NOVOLOG FLEXPEN) 100 UNIT/ML FlexPen Inject into the skin. 8-10 units at breakfast, 10-12 units at Lunch and dinner   Insulin Glargine (LANTUS [place]) Inject 36 Units into the skin daily.    Multiple Vitamin (MULTIVITAMIN) capsule Take 1 capsule by mouth daily.   Multiple Vitamins-Minerals (ZINC PO) Take 1 tablet by mouth daily.   naproxen sodium (ALEVE) 220 MG tablet Take 220 mg by mouth daily as needed.   Omega-3 Fatty Acids (FISH OIL) 1200 MG CAPS Take 1 capsule by mouth daily.   SYNTHROID 112 MCG tablet Take 112 mcg by mouth. Every morning before breakfast and 1/2 tablet on Wednesday and Sunday   telmisartan (MICARDIS) 80 MG tablet Take 80 mg by mouth daily.  VITAMIN D PO Take 1 tablet by mouth daily.     Allergies:   Penicillins   Social History   Socioeconomic History   Marital status: Married    Spouse name: Not on file   Number of children: 2   Years of education: Not on file   Highest education level: Not on file  Occupational History   Occupation: retired   Tobacco Use   Smoking status: Never   Smokeless tobacco: Never  Vaping Use   Vaping Use: Never used  Substance and Sexual Activity   Alcohol use: No   Drug use: No   Sexual activity: Not on file  Other Topics Concern   Not on  file  Social History Narrative   Not on file   Social Determinants of Health   Financial Resource Strain: Not on file  Food Insecurity: Not on file  Transportation Needs: Not on file  Physical Activity: Not on file  Stress: Not on file  Social Connections: Not on file     Family History: The patient's family history includes Addison's disease in her sister; CAD in her mother; COPD in her sister; Diabetes in her brother; Heart failure in her mother. There is no history of Colon cancer, Esophageal cancer, Rectal cancer, or Stomach cancer.  ROS:   Please see the history of present illness.     All other systems reviewed and are negative.  EKGs/Labs/Other Studies Reviewed:    The following studies were reviewed today:   EKG:   01/20/2021: Normal sinus rhythm, biatrial enlargement, rate 80, no ST abnormality  TTE 10/31/13: - Left ventricle: The cavity size was normal. Systolic function was    vigorous. The estimated ejection fraction was in the range of 65%    to 70%. Wall motion was normal; there were no regional wall    motion abnormalities. Doppler parameters are consistent with    abnormal left ventricular relaxation (grade 1 diastolic    dysfunction). Doppler parameters are consistent with elevated    ventricular end-diastolic filling pressure.  - Aortic valve: Trileaflet; mildly thickened leaflets.    Transvalvular velocity was within the normal range. There was no    stenosis. There was mild regurgitation.  - Aortic root: The aortic root was normal in size.  - Mitral valve: Severe mitral annular calcification, predominantly    posterior.  - Left atrium: The atrium was mildly dilated.  - Right ventricle: Systolic function was normal.  - Right atrium: The atrium was normal in size.  - Pulmonary arteries: Systolic pressure was within the normal    range.  - Pericardium, extracardiac: There was no pericardial effusion.   Impressions:   - Normal biventricular size and  systolic function.    Abnormal relaxation with elevated filling pressures.    Mild aortic regurgitation.   Cath 12/30/15: 1. Moderate small vessel coronary artery disease involving the first diagonal and RCA subbranches. 2. Widely patent RCA, LAD, left main, and left circumflex vessels 3. Normal LVEDP 4. Heavily calcified mitral annulus   Recommendations: Medical therapy   Recent Labs: No results found for requested labs within last 8760 hours.  Recent Lipid Panel No results found for: CHOL, TRIG, HDL, CHOLHDL, VLDL, LDLCALC, LDLDIRECT  Physical Exam:    VS:  BP (!) 138/52   Pulse 80   Ht 5\' 3"  (1.6 m)   Wt 121 lb 9.6 oz (55.2 kg)   SpO2 98%   BMI 21.54 kg/m     Wt Readings from  Last 3 Encounters:  01/20/21 121 lb 9.6 oz (55.2 kg)  01/22/20 127 lb 9.6 oz (57.9 kg)  07/24/19 132 lb 3.2 oz (60 kg)     GEN:   in no acute distress HEENT: Normal NECK: No JVD CARDIAC: RRR, no murmurs, rubs, gallops RESPIRATORY:  Clear to auscultation without rales, wheezing or rhonchi  ABDOMEN: Soft, non-tender, non-distended MUSCULOSKELETAL:  No edema; No deformity  SKIN: Warm and dry NEUROLOGIC:  Alert and oriented x 3 PSYCHIATRIC:  Normal affect   ASSESSMENT:    1. CAD in native artery   2. Aortic valve insufficiency, etiology of cardiac valve disease unspecified   3. Hyperlipidemia, unspecified hyperlipidemia type   4. Essential hypertension     PLAN:    CAD: Cath on 12/30/2015 showed moderate small vessel CAD involving the first diagonal and RCA subbranches.  Normal LV systolic function on TTE in 2015.  Having shortness of breath with minimal exertion  TTE on 06/29/2019 showed normal biventricular function, mild mitral stenosis, mild aortic regurgitation.  Lexiscan Myoview on 06/22/2019 showed normal perfusion.  Currently denies any anginal symptoms -Continue aspirin 81 mg daily. -Continue atorvastatin 80 mg daily.  Will check lipid panel  Aortic regurgitation: Mild on TTE  06/29/19.  Will monitor   Hyperlipidemia: On atorvastatin 80 mg daily.  LDL 50 on 09/12/19.  Will check lipid panel  Hypertension: On telmisartan 80 mg daily and amlodipine 10 mg daily.  Check CMET  Type 2 diabetes: On insulin.  A1c 8.3% on 05/30/20  RTC in 1 year  Medication Adjustments/Labs and Tests Ordered: Current medicines are reviewed at length with the patient today.  Concerns regarding medicines are outlined above.  Orders Placed This Encounter  Procedures   Lipid panel   Comprehensive metabolic panel   CBC   EKG 12-Lead    No orders of the defined types were placed in this encounter.   Patient Instructions  Medication Instructions:  Your physician recommends that you continue on your current medications as directed. Please refer to the Current Medication list given to you today.  *If you need a refill on your cardiac medications before your next appointment, please call your pharmacy*   Lab Work: CMET, CBC, Lipid today  If you have labs (blood work) drawn today and your tests are completely normal, you will receive your results only by: MyChart Message (if you have MyChart) OR A paper copy in the mail If you have any lab test that is abnormal or we need to change your treatment, we will call you to review the results.  Follow-Up: At [facility], you and your health needs are our priority.  As part of our continuing mission to provide you with exceptional heart care, we have created designated Provider Care Teams.  These Care Teams include your primary Cardiologist (physician) and Advanced Practice Providers (APPs -  Physician Assistants and Nurse Practitioners) who all work together to provide you with the care you need, when you need it.  We recommend signing up for the patient portal called "MyChart".  Sign up information is provided on this After Visit Summary.  MyChart is used to connect with patients for Virtual Visits (Telemedicine).  Patients are able to view  lab/test results, encounter notes, upcoming appointments, etc.  Non-urgent messages can be sent to your provider as well.   To learn more about what you can do with MyChart, go to [email or web address].    Your next appointment:   12 month(s)  The format for  your next appointment:   In Person  Provider:   Donato Heinz, MD {   Signed, Donato Heinz, MD  01/20/2021 1:46 PM    [facility]

## 2021-01-19 ENCOUNTER — Other Ambulatory Visit: Payer: Self-pay

## 2021-01-19 ENCOUNTER — Ambulatory Visit (INDEPENDENT_AMBULATORY_CARE_PROVIDER_SITE_OTHER): Payer: Medicare PPO | Admitting: Ophthalmology

## 2021-01-19 ENCOUNTER — Encounter (INDEPENDENT_AMBULATORY_CARE_PROVIDER_SITE_OTHER): Payer: Self-pay | Admitting: Ophthalmology

## 2021-01-19 DIAGNOSIS — H43813 Vitreous degeneration, bilateral: Secondary | ICD-10-CM

## 2021-01-19 DIAGNOSIS — E103393 Type 1 diabetes mellitus with moderate nonproliferative diabetic retinopathy without macular edema, bilateral: Secondary | ICD-10-CM

## 2021-01-19 DIAGNOSIS — H353131 Nonexudative age-related macular degeneration, bilateral, early dry stage: Secondary | ICD-10-CM

## 2021-01-19 NOTE — Assessment & Plan Note (Signed)
Incidental physiologic

## 2021-01-19 NOTE — Assessment & Plan Note (Signed)
No progression of mild to moderate DR

## 2021-01-19 NOTE — Progress Notes (Signed)
01/19/2021     CHIEF COMPLAINT Patient presents for  Chief Complaint  Patient presents with   Retina Follow Up      HISTORY OF PRESENT ILLNESS: Abigail Green is a 75 y.o. female who presents to the clinic today for:   HPI     Retina Follow Up   Patient presents with  Diabetic Retinopathy.  In both eyes.  This started 8 months ago.  Duration of 8 months.  Since onset it is stable.        Comments   8 month f/u OU with OCT and FP.      Last edited by Reather Littler, COA on 01/19/2021 10:00 AM.      Referring physician: Reynold Bowen, MD [place],  [place] [address]  HISTORICAL INFORMATION:   Selected notes from the [place]: No current outpatient medications on file. (Ophthalmic Drugs)   No current facility-administered medications for this visit. (Ophthalmic Drugs)   Current Outpatient Medications (Other)  Medication Sig   ACCU-CHEK GUIDE test strip    Accu-Chek Softclix Lancets lancets    amLODipine (NORVASC) 10 MG tablet TAKE 1 TABLET(10 MG) BY MOUTH DAILY   aspirin EC 81 MG tablet Take 81 mg by mouth daily.   atorvastatin (LIPITOR) 80 MG tablet Take 1 tablet by mouth every evening.   Calcium Carbonate (CALCIUM 600 PO) Take 2 tablets by mouth in the morning and at bedtime.   insulin aspart (NOVOLOG FLEXPEN) 100 UNIT/ML FlexPen Inject into the skin. 8-10 units at breakfast, 10-12 units at Lunch and dinner   Insulin Glargine (LANTUS [place]) Inject 36 Units into the skin daily.    Multiple Vitamin (MULTIVITAMIN) capsule Take 1 capsule by mouth daily.   Multiple Vitamins-Minerals (ZINC PO) Take 1 tablet by mouth daily.   naproxen sodium (ALEVE) 220 MG tablet Take 220 mg by mouth daily as needed.   Omega-3 Fatty Acids (FISH OIL) 1200 MG CAPS Take 1 capsule by mouth daily.   SYNTHROID 112 MCG tablet Take 112 mcg by mouth. Every morning before breakfast and 1/2 tablet on Wednesday and Sunday   telmisartan  (MICARDIS) 80 MG tablet Take 80 mg by mouth daily.   VITAMIN D PO Take 1 tablet by mouth daily.   No current facility-administered medications for this visit. (Other)      REVIEW OF SYSTEMS:    ALLERGIES Allergies  Allergen Reactions   Penicillins Rash    Has patient had a PCN reaction causing immediate rash, facial/tongue/throat swelling, SOB or lightheadedness with hypotension:YES Has patient had a PCN reaction causing severe rash involving mucus membranes or skin necrosis:unsure. Has patient had a PCN reaction that required hospitalization:PT. WAS INPATIENT WHEN REACTION OCCURRED. Has patient had a PCN reaction occurring within the last 10 years:No If all of the above answers are "NO", then may proceed with Cephalosporin use.     PAST MEDICAL HISTORY Past Medical History:  Diagnosis Date   Cataract    removed bilaterally   Diabetes mellitus type 1 ([place]) 12/29/2015   Essential hypertension 12/29/2015   Family history of premature coronary artery disease 12/29/2015   Hyperlipidemia 12/29/2015   Hypoparathyroidism ([place])    Past Surgical History:  Procedure Laterality Date   BREAST CYST EXCISION Left    CARDIAC CATHETERIZATION N/A 12/30/2015   Procedure: Left Heart Cath and Coronary Angiography;  Surgeon: Sherren Mocha, MD;  Location: [facility];  Service:  Cardiovascular;  Laterality: N/A;   CATARACT EXTRACTION, BILATERAL     CESAREAN SECTION     x 2   THYROIDECTOMY     TRIGGER FINGER RELEASE Right    Thumb, A1 pully and incidental synovectomy of superficialis profundus tendons   TRIGGER FINGER RELEASE Left     FAMILY HISTORY Family History  Problem Relation Age of Onset   Heart failure Mother    CAD Mother    COPD Sister    Diabetes Brother    Addison's disease Sister    Colon cancer Neg Hx    Esophageal cancer Neg Hx    Rectal cancer Neg Hx    Stomach cancer Neg Hx     SOCIAL HISTORY Social History   Tobacco Use   Smoking status: Never    Smokeless tobacco: Never  Vaping Use   Vaping Use: Never used  Substance Use Topics   Alcohol use: No   Drug use: No         OPHTHALMIC EXAM:  Base Eye Exam     Visual Acuity (ETDRS)       Right Left   Dist [place] 20/20 -1 20/20         Tonometry (Tonopen, 10:05 AM)       Right Left   Pressure 17 16         Pupils       Pupils Dark Light Shape React APD   Right PERRL 4 3 Round Brisk None   Left PERRL 4 3 Round Brisk None         Visual Fields (Counting fingers)       Left Right    Full Full         Extraocular Movement       Right Left    Full, Ortho Full, Ortho         Neuro/Psych     Oriented x3: Yes   Mood/Affect: Normal         Dilation     Both eyes: 1.0% Mydriacyl, 2.5% Phenylephrine @ 10:05 AM           Slit Lamp and Fundus Exam     External Exam       Right Left   External Normal Normal         Slit Lamp Exam       Right Left   Lids/Lashes Normal Normal   Conjunctiva/Sclera White and quiet White and quiet   Cornea Clear Clear   Anterior Chamber Deep and quiet Deep and quiet   Iris Round and reactive Round and reactive   Lens Posterior chamber intraocular lens Posterior chamber intraocular lens   Anterior Vitreous Normal Normal         Fundus Exam       Right Left   Posterior Vitreous Normal Normal   Disc Normal Normal   C/D Ratio 0.4 0.4   Macula no macular thickening, no clinically significant macular edema, Microaneurysms no macular thickening, no clinically significant macular edema, Microaneurysms   Vessels NPDR- Moderate NPDR- Moderate   Periphery Normal Normal            IMAGING AND PROCEDURES  Imaging and Procedures for 01/19/21  OCT, Retina - OU - Both Eyes       Right Eye Quality was good. Scan locations included subfoveal. Central Foveal Thickness: 286. Findings include normal foveal contour, retinal drusen .   Left Eye Quality was good. Scan locations included subfoveal. Central  Foveal Thickness: 286. Progression has been stable. Findings include normal foveal contour, retinal drusen .   Notes No active maculopathy OU, no sign of active CNVM     Color Fundus Photography Optos - OU - Both Eyes       Right Eye Progression has been stable. Macula : drusen. Vessels : normal observations. Periphery : normal observations.   Left Eye Progression has been stable. Disc findings include normal observations. Macula : drusen. Periphery : normal observations.              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 Posterior vitreous detachment of both eyes  H43.813     3. Early stage nonexudative age-related macular degeneration of both eyes  H35.3131       1.  No detectable progression of diabetic retinopathy type 1 diabetes mellitus with 55 years we will continue to monitor this mild to moderate NPDR  2.  3.  Ophthalmic Meds Ordered this visit:  No orders of the defined types were placed in this encounter.      Return in about 9 months (around 10/19/2021) for DILATE OU, COLOR FP, OCT.  There are no Patient Instructions on file for this visit.   Explained the diagnoses, plan, and follow up with the patient and they expressed understanding.  Patient expressed understanding of the importance of proper follow up care.   Alford Highland Makaelyn Aponte M.D. Diseases & Surgery of the Retina and Vitreous [facility] 01/19/21     Abbreviations: M  myopia (nearsighted); A astigmatism; H hyperopia (farsighted); P presbyopia; Mrx spectacle prescription;  CTL contact lenses; OD right eye; OS left eye; OU both eyes  XT exotropia; ET esotropia; PEK punctate epithelial keratitis; PEE punctate epithelial erosions; DES dry eye syndrome; MGD meibomian gland dysfunction; ATs artificial tears; PFAT's preservative free artificial tears; NSC nuclear sclerotic cataract; PSC posterior subcapsular cataract; ERM epi-retinal membrane; PVD posterior vitreous detachment; RD retinal detachment; DM diabetes mellitus; DR diabetic retinopathy; NPDR non-proliferative diabetic retinopathy; PDR proliferative diabetic retinopathy; CSME clinically significant macular edema; DME diabetic macular edema; dbh dot blot hemorrhages; CWS cotton wool spot; POAG primary open angle glaucoma; C/D cup-to-disc ratio; HVF humphrey visual field; GVF goldmann visual field; OCT optical coherence tomography; IOP intraocular pressure; BRVO Branch retinal vein occlusion; CRVO central retinal vein occlusion; CRAO central retinal artery occlusion; BRAO branch retinal artery occlusion; RT retinal tear; SB scleral buckle; PPV pars plana vitrectomy; VH Vitreous hemorrhage; PRP panretinal laser photocoagulation; IVK intravitreal kenalog; VMT vitreomacular traction; MH Macular hole;  NVD neovascularization of the disc; NVE neovascularization elsewhere; AREDS age related eye disease study; ARMD age related macular degeneration; POAG primary open angle glaucoma; EBMD epithelial/anterior basement membrane dystrophy; ACIOL anterior chamber intraocular lens; IOL intraocular lens; PCIOL posterior chamber intraocular lens; Phaco/IOL phacoemulsification with intraocular lens placement; PRK photorefractive keratectomy; LASIK laser assisted in situ keratomileusis; HTN hypertension; DM diabetes mellitus; COPD chronic obstructive pulmonary disease

## 2021-01-19 NOTE — Assessment & Plan Note (Signed)
Observe only

## 2021-01-20 ENCOUNTER — Ambulatory Visit: Payer: Medicare PPO | Admitting: Cardiology

## 2021-01-20 VITALS — BP 138/52 | HR 80 | Ht 63.0 in | Wt 121.6 lb

## 2021-01-20 DIAGNOSIS — E785 Hyperlipidemia, unspecified: Secondary | ICD-10-CM | POA: Diagnosis not present

## 2021-01-20 DIAGNOSIS — I2510 Atherosclerotic heart disease of native coronary artery without angina pectoris: Secondary | ICD-10-CM

## 2021-01-20 DIAGNOSIS — I10 Essential (primary) hypertension: Secondary | ICD-10-CM

## 2021-01-20 DIAGNOSIS — I351 Nonrheumatic aortic (valve) insufficiency: Secondary | ICD-10-CM

## 2021-01-20 NOTE — Patient Instructions (Signed)
Medication Instructions:  Your physician recommends that you continue on your current medications as directed. Please refer to the Current Medication list given to you today.  *If you need a refill on your cardiac medications before your next appointment, please call your pharmacy*   Lab Work: CMET, CBC, Lipid today  If you have labs (blood work) drawn today and your tests are completely normal, you will receive your results only by: MyChart Message (if you have MyChart) OR A paper copy in the mail If you have any lab test that is abnormal or we need to change your treatment, we will call you to review the results.  Follow-Up: At [facility], you and your health needs are our priority.  As part of our continuing mission to provide you with exceptional heart care, we have created designated Provider Care Teams.  These Care Teams include your primary Cardiologist (physician) and Advanced Practice Providers (APPs -  Physician Assistants and Nurse Practitioners) who all work together to provide you with the care you need, when you need it.  We recommend signing up for the patient portal called "MyChart".  Sign up information is provided on this After Visit Summary.  MyChart is used to connect with patients for Virtual Visits (Telemedicine).  Patients are able to view lab/test results, encounter notes, upcoming appointments, etc.  Non-urgent messages can be sent to your provider as well.   To learn more about what you can do with MyChart, go to [email or web address].    Your next appointment:   12 month(s)  The format for your next appointment:   In Person  Provider:   Little Ishikawa, MD {

## 2021-01-21 LAB — COMPREHENSIVE METABOLIC PANEL
ALT: 14 IU/L (ref 0–32)
AST: 26 IU/L (ref 0–40)
Albumin/Globulin Ratio: 2.4 — ABNORMAL HIGH (ref 1.2–2.2)
Albumin: 4.5 g/dL (ref 3.7–4.7)
Alkaline Phosphatase: 61 IU/L (ref 44–121)
BUN/Creatinine Ratio: 19 (ref 12–28)
BUN: 20 mg/dL (ref 8–27)
Bilirubin Total: 0.4 mg/dL (ref 0.0–1.2)
CO2: 23 mmol/L (ref 20–29)
Calcium: 9.5 mg/dL (ref 8.7–10.3)
Chloride: 101 mmol/L (ref 96–106)
Creatinine, Ser: 1.05 mg/dL — ABNORMAL HIGH (ref 0.57–1.00)
Globulin, Total: 1.9 g/dL (ref 1.5–4.5)
Glucose: 125 mg/dL — ABNORMAL HIGH (ref 70–99)
Potassium: 4.8 mmol/L (ref 3.5–5.2)
Sodium: 140 mmol/L (ref 134–144)
Total Protein: 6.4 g/dL (ref 6.0–8.5)
eGFR: 55 mL/min/{1.73_m2} — ABNORMAL LOW (ref >59)

## 2021-01-21 LAB — LIPID PANEL
Chol/HDL Ratio: 1.5 ratio (ref 0.0–4.4)
Cholesterol, Total: 128 mg/dL (ref 100–199)
HDL: 83 mg/dL (ref >39)
LDL Chol Calc (NIH): 32 mg/dL (ref 0–99)
Triglycerides: 62 mg/dL (ref 0–149)
VLDL Cholesterol Cal: 13 mg/dL (ref 5–40)

## 2021-01-21 LAB — CBC
Hematocrit: 33.2 % — ABNORMAL LOW (ref 34.0–46.6)
Hemoglobin: 10.8 g/dL — ABNORMAL LOW (ref 11.1–15.9)
MCH: 30.2 pg (ref 26.6–33.0)
MCHC: 32.5 g/dL (ref 31.5–35.7)
MCV: 93 fL (ref 79–97)
Platelets: 237 10*3/uL (ref 150–450)
RBC: 3.58 x10E6/uL — ABNORMAL LOW (ref 3.77–5.28)
RDW: 13.1 % (ref 11.7–15.4)
WBC: 6.9 10*3/uL (ref 3.4–10.8)

## 2021-02-03 ENCOUNTER — Encounter: Payer: Self-pay | Admitting: *Deleted

## 2021-02-10 ENCOUNTER — Other Ambulatory Visit: Payer: Self-pay | Admitting: Cardiology

## 2021-02-10 NOTE — Telephone Encounter (Signed)
This is Dr. Schumann's pt 

## 2021-05-19 DIAGNOSIS — E785 Hyperlipidemia, unspecified: Secondary | ICD-10-CM | POA: Diagnosis not present

## 2021-05-19 DIAGNOSIS — I2510 Atherosclerotic heart disease of native coronary artery without angina pectoris: Secondary | ICD-10-CM | POA: Diagnosis not present

## 2021-05-19 DIAGNOSIS — I129 Hypertensive chronic kidney disease with stage 1 through stage 4 chronic kidney disease, or unspecified chronic kidney disease: Secondary | ICD-10-CM | POA: Diagnosis not present

## 2021-05-19 DIAGNOSIS — E209 Hypoparathyroidism, unspecified: Secondary | ICD-10-CM | POA: Diagnosis not present

## 2021-05-19 DIAGNOSIS — E890 Postprocedural hypothyroidism: Secondary | ICD-10-CM | POA: Diagnosis not present

## 2021-05-19 DIAGNOSIS — E538 Deficiency of other specified B group vitamins: Secondary | ICD-10-CM | POA: Diagnosis not present

## 2021-05-19 DIAGNOSIS — E103393 Type 1 diabetes mellitus with moderate nonproliferative diabetic retinopathy without macular edema, bilateral: Secondary | ICD-10-CM | POA: Diagnosis not present

## 2021-05-19 DIAGNOSIS — E1022 Type 1 diabetes mellitus with diabetic chronic kidney disease: Secondary | ICD-10-CM | POA: Diagnosis not present

## 2021-05-19 DIAGNOSIS — N1831 Chronic kidney disease, stage 3a: Secondary | ICD-10-CM | POA: Diagnosis not present

## 2021-05-30 DIAGNOSIS — E1022 Type 1 diabetes mellitus with diabetic chronic kidney disease: Secondary | ICD-10-CM | POA: Diagnosis not present

## 2021-06-02 DIAGNOSIS — E1022 Type 1 diabetes mellitus with diabetic chronic kidney disease: Secondary | ICD-10-CM | POA: Diagnosis not present

## 2021-06-04 DIAGNOSIS — L821 Other seborrheic keratosis: Secondary | ICD-10-CM | POA: Diagnosis not present

## 2021-06-04 DIAGNOSIS — L570 Actinic keratosis: Secondary | ICD-10-CM | POA: Diagnosis not present

## 2021-06-30 DIAGNOSIS — E1022 Type 1 diabetes mellitus with diabetic chronic kidney disease: Secondary | ICD-10-CM | POA: Diagnosis not present

## 2021-07-23 DIAGNOSIS — I2510 Atherosclerotic heart disease of native coronary artery without angina pectoris: Secondary | ICD-10-CM | POA: Diagnosis not present

## 2021-07-23 DIAGNOSIS — N1831 Chronic kidney disease, stage 3a: Secondary | ICD-10-CM | POA: Diagnosis not present

## 2021-07-23 DIAGNOSIS — Z794 Long term (current) use of insulin: Secondary | ICD-10-CM | POA: Diagnosis not present

## 2021-07-23 DIAGNOSIS — I129 Hypertensive chronic kidney disease with stage 1 through stage 4 chronic kidney disease, or unspecified chronic kidney disease: Secondary | ICD-10-CM | POA: Diagnosis not present

## 2021-07-23 DIAGNOSIS — E1022 Type 1 diabetes mellitus with diabetic chronic kidney disease: Secondary | ICD-10-CM | POA: Diagnosis not present

## 2021-07-30 DIAGNOSIS — E1022 Type 1 diabetes mellitus with diabetic chronic kidney disease: Secondary | ICD-10-CM | POA: Diagnosis not present

## 2021-08-30 DIAGNOSIS — E1022 Type 1 diabetes mellitus with diabetic chronic kidney disease: Secondary | ICD-10-CM | POA: Diagnosis not present

## 2021-09-24 DIAGNOSIS — N958 Other specified menopausal and perimenopausal disorders: Secondary | ICD-10-CM | POA: Diagnosis not present

## 2021-09-24 DIAGNOSIS — M4126 Other idiopathic scoliosis, lumbar region: Secondary | ICD-10-CM | POA: Diagnosis not present

## 2021-09-24 DIAGNOSIS — Z6821 Body mass index (BMI) 21.0-21.9, adult: Secondary | ICD-10-CM | POA: Diagnosis not present

## 2021-09-24 DIAGNOSIS — R739 Hyperglycemia, unspecified: Secondary | ICD-10-CM | POA: Diagnosis not present

## 2021-09-24 DIAGNOSIS — E890 Postprocedural hypothyroidism: Secondary | ICD-10-CM | POA: Diagnosis not present

## 2021-09-24 DIAGNOSIS — R7989 Other specified abnormal findings of blood chemistry: Secondary | ICD-10-CM | POA: Diagnosis not present

## 2021-09-24 DIAGNOSIS — Z1231 Encounter for screening mammogram for malignant neoplasm of breast: Secondary | ICD-10-CM | POA: Diagnosis not present

## 2021-09-24 DIAGNOSIS — E538 Deficiency of other specified B group vitamins: Secondary | ICD-10-CM | POA: Diagnosis not present

## 2021-09-24 DIAGNOSIS — Z124 Encounter for screening for malignant neoplasm of cervix: Secondary | ICD-10-CM | POA: Diagnosis not present

## 2021-09-24 DIAGNOSIS — Z1151 Encounter for screening for human papillomavirus (HPV): Secondary | ICD-10-CM | POA: Diagnosis not present

## 2021-09-24 DIAGNOSIS — E785 Hyperlipidemia, unspecified: Secondary | ICD-10-CM | POA: Diagnosis not present

## 2021-09-24 DIAGNOSIS — I10 Essential (primary) hypertension: Secondary | ICD-10-CM | POA: Diagnosis not present

## 2021-09-24 DIAGNOSIS — R29890 Loss of height: Secondary | ICD-10-CM | POA: Diagnosis not present

## 2021-09-24 DIAGNOSIS — E310 Autoimmune polyglandular failure: Secondary | ICD-10-CM | POA: Diagnosis not present

## 2021-09-24 DIAGNOSIS — Z0000 Encounter for general adult medical examination without abnormal findings: Secondary | ICD-10-CM | POA: Diagnosis not present

## 2021-09-24 DIAGNOSIS — N1831 Chronic kidney disease, stage 3a: Secondary | ICD-10-CM | POA: Diagnosis not present

## 2021-09-27 DIAGNOSIS — Z0000 Encounter for general adult medical examination without abnormal findings: Secondary | ICD-10-CM | POA: Diagnosis not present

## 2021-09-27 DIAGNOSIS — E538 Deficiency of other specified B group vitamins: Secondary | ICD-10-CM | POA: Diagnosis not present

## 2021-09-29 DIAGNOSIS — E1022 Type 1 diabetes mellitus with diabetic chronic kidney disease: Secondary | ICD-10-CM | POA: Diagnosis not present

## 2021-10-01 DIAGNOSIS — E785 Hyperlipidemia, unspecified: Secondary | ICD-10-CM | POA: Diagnosis not present

## 2021-10-01 DIAGNOSIS — I2510 Atherosclerotic heart disease of native coronary artery without angina pectoris: Secondary | ICD-10-CM | POA: Diagnosis not present

## 2021-10-01 DIAGNOSIS — E890 Postprocedural hypothyroidism: Secondary | ICD-10-CM | POA: Diagnosis not present

## 2021-10-01 DIAGNOSIS — Z1389 Encounter for screening for other disorder: Secondary | ICD-10-CM | POA: Diagnosis not present

## 2021-10-01 DIAGNOSIS — E209 Hypoparathyroidism, unspecified: Secondary | ICD-10-CM | POA: Diagnosis not present

## 2021-10-01 DIAGNOSIS — Z794 Long term (current) use of insulin: Secondary | ICD-10-CM | POA: Diagnosis not present

## 2021-10-01 DIAGNOSIS — E1022 Type 1 diabetes mellitus with diabetic chronic kidney disease: Secondary | ICD-10-CM | POA: Diagnosis not present

## 2021-10-01 DIAGNOSIS — N1831 Chronic kidney disease, stage 3a: Secondary | ICD-10-CM | POA: Diagnosis not present

## 2021-10-01 DIAGNOSIS — I10 Essential (primary) hypertension: Secondary | ICD-10-CM | POA: Diagnosis not present

## 2021-10-01 DIAGNOSIS — Z008 Encounter for other general examination: Secondary | ICD-10-CM | POA: Diagnosis not present

## 2021-10-01 DIAGNOSIS — R82998 Other abnormal findings in urine: Secondary | ICD-10-CM | POA: Diagnosis not present

## 2021-10-01 DIAGNOSIS — Z1331 Encounter for screening for depression: Secondary | ICD-10-CM | POA: Diagnosis not present

## 2021-10-01 DIAGNOSIS — I129 Hypertensive chronic kidney disease with stage 1 through stage 4 chronic kidney disease, or unspecified chronic kidney disease: Secondary | ICD-10-CM | POA: Diagnosis not present

## 2021-10-19 ENCOUNTER — Ambulatory Visit (INDEPENDENT_AMBULATORY_CARE_PROVIDER_SITE_OTHER): Payer: Medicare PPO | Admitting: Ophthalmology

## 2021-10-19 ENCOUNTER — Encounter (INDEPENDENT_AMBULATORY_CARE_PROVIDER_SITE_OTHER): Payer: Self-pay | Admitting: Ophthalmology

## 2021-10-19 DIAGNOSIS — H353131 Nonexudative age-related macular degeneration, bilateral, early dry stage: Secondary | ICD-10-CM | POA: Diagnosis not present

## 2021-10-19 DIAGNOSIS — E103393 Type 1 diabetes mellitus with moderate nonproliferative diabetic retinopathy without macular edema, bilateral: Secondary | ICD-10-CM

## 2021-10-19 NOTE — Progress Notes (Signed)
10/19/2021     CHIEF COMPLAINT Patient presents for  Chief Complaint  Patient presents with   Diabetic Retinopathy without Macular Edema      HISTORY OF PRESENT ILLNESS: Abigail Green is a 76 y.o. female who presents to the clinic today for:   HPI   9 mths fu ou oct fp Pt states her vision has been stable Pt denies any new floaters or FOL   56 years of DM Last edited by Edmon Crape, MD on 10/19/2021 12:03 PM.      Referring physician: Adrian Prince, MD [address] [place],  [place] [address]  HISTORICAL INFORMATION:   Selected notes from the MEDICAL RECORD NUMBER       CURRENT MEDICATIONS: No current outpatient medications on file. (Ophthalmic Drugs)   No current facility-administered medications for this visit. (Ophthalmic Drugs)   Current Outpatient Medications (Other)  Medication Sig   ACCU-CHEK GUIDE test strip    Accu-Chek Softclix Lancets lancets    amLODipine (NORVASC) 10 MG tablet TAKE 1 TABLET(10 MG) BY MOUTH DAILY   aspirin EC 81 MG tablet Take 81 mg by mouth daily.   atorvastatin (LIPITOR) 80 MG tablet Take 1 tablet by mouth every evening.   Calcium Carbonate (CALCIUM 600 PO) Take 2 tablets by mouth in the morning and at bedtime.   insulin aspart (NOVOLOG FLEXPEN) 100 UNIT/ML FlexPen Inject into the skin. 8-10 units at breakfast, 10-12 units at Lunch and dinner   Insulin Glargine (LANTUS [place]) Inject 36 Units into the skin daily.    Multiple Vitamin (MULTIVITAMIN) capsule Take 1 capsule by mouth daily.   Multiple Vitamins-Minerals (ZINC PO) Take 1 tablet by mouth daily.   naproxen sodium (ALEVE) 220 MG tablet Take 220 mg by mouth daily as needed.   Omega-3 Fatty Acids (FISH OIL) 1200 MG CAPS Take 1 capsule by mouth daily.   SYNTHROID 112 MCG tablet Take 112 mcg by mouth. Every morning before breakfast and 1/2 tablet on Wednesday and Sunday   telmisartan (MICARDIS) 80 MG tablet Take 80 mg by mouth daily.   VITAMIN D PO Take 1 tablet by mouth  daily.   No current facility-administered medications for this visit. (Other)      REVIEW OF SYSTEMS: ROS   Negative for: Constitutional, Gastrointestinal, Neurological, Skin, Genitourinary, Musculoskeletal, HENT, Endocrine, Cardiovascular, Eyes, Respiratory, Psychiatric, Allergic/Imm, Heme/Lymph Last edited by Aleene Davidson, CMA on 10/19/2021 11:04 AM.       ALLERGIES Allergies  Allergen Reactions   Penicillins Rash    Has patient had a PCN reaction causing immediate rash, facial/tongue/throat swelling, SOB or lightheadedness with hypotension:YES Has patient had a PCN reaction causing severe rash involving mucus membranes or skin necrosis:unsure. Has patient had a PCN reaction that required hospitalization:PT. WAS INPATIENT WHEN REACTION OCCURRED. Has patient had a PCN reaction occurring within the last 10 years:No If all of the above answers are "NO", then may proceed with Cephalosporin use.     PAST MEDICAL HISTORY Past Medical History:  Diagnosis Date   Cataract    removed bilaterally   Diabetes mellitus type 1 (HCC) 12/29/2015   Essential hypertension 12/29/2015   Family history of premature coronary artery disease 12/29/2015   Hyperlipidemia 12/29/2015   Hypoparathyroidism (HCC)    Past Surgical History:  Procedure Laterality Date   BREAST CYST EXCISION Left    CARDIAC CATHETERIZATION N/A 12/30/2015   Procedure: Left Heart Cath and Coronary Angiography;  Surgeon: Tonny Bollman, MD;  Location: [facility];  Service: Cardiovascular;  Laterality: N/A;   CATARACT EXTRACTION, BILATERAL     CESAREAN SECTION     x 2   THYROIDECTOMY     TRIGGER FINGER RELEASE Right    Thumb, A1 pully and incidental synovectomy of superficialis profundus tendons   TRIGGER FINGER RELEASE Left     FAMILY HISTORY Family History  Problem Relation Age of Onset   Heart failure Mother    CAD Mother    COPD Sister    Diabetes Brother    Addison's disease Sister    Colon  cancer Neg Hx    Esophageal cancer Neg Hx    Rectal cancer Neg Hx    Stomach cancer Neg Hx     SOCIAL HISTORY Social History   Tobacco Use   Smoking status: Never   Smokeless tobacco: Never  Vaping Use   Vaping Use: Never used  Substance Use Topics   Alcohol use: No   Drug use: No         OPHTHALMIC EXAM:  Base Eye Exam     Visual Acuity (ETDRS)       Right Left   Dist [place] 20/30 20/20         Tonometry (Tonopen, 11:07 AM)       Right Left   Pressure 20 18         Extraocular Movement       Right Left    Ortho Ortho    -- -- --  --  --  -- -- --   -- -- --  --  --  -- -- --           Neuro/Psych     Oriented x3: Yes   Mood/Affect: Normal         Dilation     Both eyes: 1.0% Mydriacyl, 2.5% Phenylephrine @ 11:05 AM           Slit Lamp and Fundus Exam     External Exam       Right Left   External Normal Normal         Slit Lamp Exam       Right Left   Lids/Lashes Normal Normal   Conjunctiva/Sclera White and quiet White and quiet   Cornea Clear Clear   Anterior Chamber Deep and quiet Deep and quiet   Iris Round and reactive Round and reactive   Lens Posterior chamber intraocular lens Posterior chamber intraocular lens   Anterior Vitreous Normal Normal         Fundus Exam       Right Left   Posterior Vitreous Normal Normal   Disc Normal Normal   C/D Ratio 0.4 0.4   Macula no macular thickening, no clinically significant macular edema, Microaneurysms no macular thickening, no clinically significant macular edema, Microaneurysms   Vessels NPDR- Moderate NPDR- Moderate   Periphery Normal, inferotemporal small chorioretinal scarring active stable Normal            IMAGING AND PROCEDURES  Imaging and Procedures for 10/19/21  OCT, Retina - OU - Both Eyes       Right Eye Quality was good. Scan locations included subfoveal. Central Foveal Thickness: 295. Findings include normal foveal contour, retinal drusen .    Left Eye Quality was good. Scan locations included subfoveal. Central Foveal Thickness: 2920. Progression has been stable. Findings include normal foveal contour, retinal drusen .   Notes No active maculopathy OU, no sign of active CNVM  Color Fundus Photography Optos - OU - Both Eyes       Right Eye Progression has been stable. Disc findings include normal observations. Macula : drusen. Periphery : normal observations.   Left Eye Progression has been stable. Disc findings include normal observations. Macula : drusen. Periphery : normal observations.   Notes Rare microaneurysms peripherally.  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 Early stage nonexudative age-related macular degeneration of both eyes  H35.3131       1.  Stable OU follow-up in 1 year  2.  Patient's notify the office promptly if new onset visual acuity declines distortions or changes  3.  Ophthalmic Meds Ordered this visit:  No orders of the defined types were placed in this encounter.      Return in about 1 year (around 10/20/2022) for DILATE OU, COLOR FP, OCT.  There are no Patient Instructions on file for this visit.   Explained the diagnoses, plan, and follow up with the patient and they expressed understanding.  Patient expressed understanding of the importance of proper follow up care.   Alford Highland Jini Horiuchi M.D. Diseases & Surgery of the Retina and Vitreous [facility] 10/19/21     Abbreviations: M myopia (nearsighted); A astigmatism; H hyperopia (farsighted); P presbyopia; Mrx spectacle prescription;  CTL contact lenses; OD right eye; OS left eye; OU both eyes  XT exotropia; ET esotropia; PEK punctate epithelial keratitis; PEE punctate epithelial erosions; DES dry eye syndrome; MGD meibomian gland dysfunction; ATs artificial tears; PFAT's preservative free artificial tears; NSC nuclear sclerotic cataract; PSC posterior subcapsular cataract; ERM epi-retinal membrane; PVD posterior vitreous detachment; RD retinal detachment; DM diabetes mellitus; DR diabetic retinopathy; NPDR non-proliferative diabetic retinopathy; PDR proliferative diabetic retinopathy; CSME clinically significant macular edema; DME diabetic macular edema; dbh dot blot hemorrhages; CWS cotton wool spot; POAG primary open angle glaucoma; C/D cup-to-disc ratio; HVF humphrey visual field; GVF goldmann visual field; OCT optical coherence tomography; IOP intraocular pressure; BRVO Branch retinal vein occlusion; CRVO central retinal vein occlusion; CRAO central retinal artery occlusion; BRAO branch retinal artery occlusion; RT retinal tear; SB scleral buckle; PPV pars plana vitrectomy; VH Vitreous hemorrhage; PRP panretinal laser photocoagulation; IVK intravitreal kenalog; VMT vitreomacular traction; MH Macular hole;  NVD neovascularization of the disc; NVE neovascularization elsewhere; AREDS age related eye disease study; ARMD age related macular degeneration; POAG primary open angle glaucoma; EBMD epithelial/anterior basement membrane dystrophy; ACIOL anterior chamber intraocular lens; IOL intraocular lens; PCIOL posterior chamber intraocular lens; Phaco/IOL phacoemulsification with intraocular lens placement; PRK photorefractive keratectomy; LASIK laser assisted in situ keratomileusis; HTN hypertension; DM diabetes mellitus; COPD chronic obstructive pulmonary disease

## 2021-10-19 NOTE — Assessment & Plan Note (Signed)
56 years duration of diabetes, no change in early moderate NPDR

## 2021-10-19 NOTE — Assessment & Plan Note (Signed)
Minor OU no high risk features

## 2021-10-30 DIAGNOSIS — E1022 Type 1 diabetes mellitus with diabetic chronic kidney disease: Secondary | ICD-10-CM | POA: Diagnosis not present

## 2021-11-30 DIAGNOSIS — E1022 Type 1 diabetes mellitus with diabetic chronic kidney disease: Secondary | ICD-10-CM | POA: Diagnosis not present

## 2021-12-02 DIAGNOSIS — I129 Hypertensive chronic kidney disease with stage 1 through stage 4 chronic kidney disease, or unspecified chronic kidney disease: Secondary | ICD-10-CM | POA: Diagnosis not present

## 2021-12-02 DIAGNOSIS — I2510 Atherosclerotic heart disease of native coronary artery without angina pectoris: Secondary | ICD-10-CM | POA: Diagnosis not present

## 2021-12-02 DIAGNOSIS — Z794 Long term (current) use of insulin: Secondary | ICD-10-CM | POA: Diagnosis not present

## 2021-12-02 DIAGNOSIS — E1022 Type 1 diabetes mellitus with diabetic chronic kidney disease: Secondary | ICD-10-CM | POA: Diagnosis not present

## 2021-12-02 DIAGNOSIS — N1831 Chronic kidney disease, stage 3a: Secondary | ICD-10-CM | POA: Diagnosis not present

## 2021-12-12 DIAGNOSIS — Z23 Encounter for immunization: Secondary | ICD-10-CM | POA: Diagnosis not present

## 2021-12-30 DIAGNOSIS — E1022 Type 1 diabetes mellitus with diabetic chronic kidney disease: Secondary | ICD-10-CM | POA: Diagnosis not present

## 2022-01-30 DIAGNOSIS — E1022 Type 1 diabetes mellitus with diabetic chronic kidney disease: Secondary | ICD-10-CM | POA: Diagnosis not present

## 2022-02-09 DIAGNOSIS — J449 Chronic obstructive pulmonary disease, unspecified: Secondary | ICD-10-CM | POA: Diagnosis not present

## 2022-02-09 DIAGNOSIS — E890 Postprocedural hypothyroidism: Secondary | ICD-10-CM | POA: Diagnosis not present

## 2022-02-09 DIAGNOSIS — N1831 Chronic kidney disease, stage 3a: Secondary | ICD-10-CM | POA: Diagnosis not present

## 2022-02-09 DIAGNOSIS — E1022 Type 1 diabetes mellitus with diabetic chronic kidney disease: Secondary | ICD-10-CM | POA: Diagnosis not present

## 2022-02-09 DIAGNOSIS — E785 Hyperlipidemia, unspecified: Secondary | ICD-10-CM | POA: Diagnosis not present

## 2022-02-09 DIAGNOSIS — E209 Hypoparathyroidism, unspecified: Secondary | ICD-10-CM | POA: Diagnosis not present

## 2022-02-09 DIAGNOSIS — I129 Hypertensive chronic kidney disease with stage 1 through stage 4 chronic kidney disease, or unspecified chronic kidney disease: Secondary | ICD-10-CM | POA: Diagnosis not present

## 2022-02-09 DIAGNOSIS — E103393 Type 1 diabetes mellitus with moderate nonproliferative diabetic retinopathy without macular edema, bilateral: Secondary | ICD-10-CM | POA: Diagnosis not present

## 2022-02-09 DIAGNOSIS — I2510 Atherosclerotic heart disease of native coronary artery without angina pectoris: Secondary | ICD-10-CM | POA: Diagnosis not present

## 2022-02-10 DIAGNOSIS — L821 Other seborrheic keratosis: Secondary | ICD-10-CM | POA: Diagnosis not present

## 2022-02-10 DIAGNOSIS — L814 Other melanin hyperpigmentation: Secondary | ICD-10-CM | POA: Diagnosis not present

## 2022-02-10 DIAGNOSIS — D225 Melanocytic nevi of trunk: Secondary | ICD-10-CM | POA: Diagnosis not present

## 2022-02-10 DIAGNOSIS — L80 Vitiligo: Secondary | ICD-10-CM | POA: Diagnosis not present

## 2022-02-27 ENCOUNTER — Other Ambulatory Visit: Payer: Self-pay | Admitting: Cardiology

## 2022-03-01 DIAGNOSIS — E1022 Type 1 diabetes mellitus with diabetic chronic kidney disease: Secondary | ICD-10-CM | POA: Diagnosis not present

## 2022-03-30 ENCOUNTER — Ambulatory Visit: Payer: Medicare PPO | Attending: Cardiology | Admitting: Cardiology

## 2022-03-30 ENCOUNTER — Encounter: Payer: Self-pay | Admitting: Cardiology

## 2022-03-30 VITALS — BP 132/56 | HR 80 | Ht 63.0 in | Wt 129.2 lb

## 2022-03-30 DIAGNOSIS — M79605 Pain in left leg: Secondary | ICD-10-CM | POA: Diagnosis not present

## 2022-03-30 DIAGNOSIS — I2510 Atherosclerotic heart disease of native coronary artery without angina pectoris: Secondary | ICD-10-CM | POA: Diagnosis not present

## 2022-03-30 DIAGNOSIS — I351 Nonrheumatic aortic (valve) insufficiency: Secondary | ICD-10-CM | POA: Diagnosis not present

## 2022-03-30 DIAGNOSIS — E785 Hyperlipidemia, unspecified: Secondary | ICD-10-CM | POA: Diagnosis not present

## 2022-03-30 DIAGNOSIS — I10 Essential (primary) hypertension: Secondary | ICD-10-CM | POA: Diagnosis not present

## 2022-03-30 DIAGNOSIS — M79604 Pain in right leg: Secondary | ICD-10-CM

## 2022-03-30 NOTE — Patient Instructions (Signed)
Medication Instructions:  No changes *If you need a refill on your cardiac medications before your next appointment, please call your pharmacy*   Lab Work: None If you have labs (blood work) drawn today and your tests are completely normal, you will receive your results only by: [place] (if you have MyChart) OR A paper copy in the mail If you have any lab test that is abnormal or we need to change your treatment, we will call you to review the results.   Testing/Procedures: Your physician has requested that you have an ankle brachial index (ABI). During this test an ultrasound and blood pressure cuff are used to evaluate the arteries that supply the arms and legs with blood. Allow thirty minutes for this exam. There are no restrictions or special instructions.  Your physician has requested that you have a lower extremity arterial duplex. This test is an ultrasound of the arteries in the legs or arms. It looks at arterial blood flow in the legs and arms. Allow one hour for Lower and Upper Arterial scans. There are no restrictions or special instructions    IN ONE YEAR: Your physician has requested that you have an echocardiogram. Echocardiography is a painless test that uses sound waves to create images of your heart. It provides your doctor with information about the size and shape of your heart and how well your heart's chambers and valves are working. This procedure takes approximately one hour. There are no restrictions for this procedure. Please do NOT wear cologne, perfume, aftershave, or lotions (deodorant is allowed). Please arrive 15 minutes prior to your appointment time.    Follow-Up: At [facility], you and your health needs are our priority.  As part of our continuing mission to provide you with exceptional heart care, we have created designated Provider Care Teams.  These Care Teams include your primary Cardiologist (physician) and Advanced Practice Providers  (APPs -  Physician Assistants and Nurse Practitioners) who all work together to provide you with the care you need, when you need it.  We recommend signing up for the patient portal called "MyChart".  Sign up information is provided on this After Visit Summary.  MyChart is used to connect with patients for Virtual Visits (Telemedicine).  Patients are able to view lab/test results, encounter notes, upcoming appointments, etc.  Non-urgent messages can be sent to your provider as well.   To learn more about what you can do with MyChart, go to [email or web address].    Your next appointment:   1 year(s) after Echo  Provider:   Donato Heinz, MD

## 2022-03-30 NOTE — Progress Notes (Signed)
Cardiology Office Note:    Date:  03/30/2022   ID:  Abigail Green, DOB 10-18-1945, MRN [id number]  PCP:  Abigail Bowen, MD  Cardiologist:  Donato Heinz, MD  Electrophysiologist:  None   Referring MD: Abigail Bowen, MD   Chief Complaint  Patient presents with   Coronary Artery Disease    History of Present Illness:    Abigail Green is a 77 y.o. female with a hx of type 1 diabetes, hypertension, hyperlipidemia who presents for follow-up.  She was referred by Dr. Forde Green for evaluation of coronary artery disease, initially seen on 06/20/2019.  She reported rare chest pain.   TTE on 10/31/2013 showed LVEF 65 to 40%, grade 1 diastolic dysfunction, mild AI, severe mitral annular calcification, normal RV function.  Cath on 12/30/2015 showed moderate small vessel CAD involving the first diagonal and RCA subbranches.  TTE on 06/29/2019 showed normal biventricular function, mild mitral stenosis, mild aortic regurgitation.  Lexiscan Myoview on 06/22/2019 showed normal perfusion.  Since last clinic visit, she reports she is doing okay.  Recently has been having leg pain with walking.  Reports some lower extremity edema.  Denies any chest pain, dyspnea, lightheadedness, syncope,  or palpitations.  Walking 30 minutes about 3 days per week.      Past Medical History:  Diagnosis Date   Cataract    removed bilaterally   Diabetes mellitus type 1 ([place]) 12/29/2015   Essential hypertension 12/29/2015   Family history of premature coronary artery disease 12/29/2015   Hyperlipidemia 12/29/2015   Hypoparathyroidism ([place])     Past Surgical History:  Procedure Laterality Date   BREAST CYST EXCISION Left    CARDIAC CATHETERIZATION N/A 12/30/2015   Procedure: Left Heart Cath and Coronary Angiography;  Surgeon: Abigail Mocha, MD;  Location: [facility];  Service: Cardiovascular;  Laterality: N/A;   CATARACT EXTRACTION, BILATERAL     CESAREAN SECTION     x 2   THYROIDECTOMY      TRIGGER FINGER RELEASE Right    Thumb, A1 pully and incidental synovectomy of superficialis profundus tendons   TRIGGER FINGER RELEASE Left     Current Medications: Current Meds  Medication Sig   amLODipine (NORVASC) 10 MG tablet TAKE 1 TABLET(10 MG) BY MOUTH DAILY   aspirin EC 81 MG tablet Take 81 mg by mouth daily.   atorvastatin (LIPITOR) 80 MG tablet Take 1 tablet by mouth every evening.   Calcium Carbonate (CALCIUM 600 PO) Take 2 tablets by mouth in the morning and at bedtime.   Continuous Blood Gluc Sensor (FREESTYLE LIBRE 2 SENSOR) MISC as directed topically change every 14 days to monitor blood glucose continuously   insulin aspart (NOVOLOG FLEXPEN) 100 UNIT/ML FlexPen Inject into the skin. 8-10 units at breakfast, 10-12 units at Lunch and dinner   Insulin Glargine (LANTUS [place]) Inject 36 Units into the skin daily.    Multiple Vitamin (MULTIVITAMIN) capsule Take 1 capsule by mouth daily.   Multiple Vitamins-Minerals (ZINC PO) Take 1 tablet by mouth daily.   naproxen sodium (ALEVE) 220 MG tablet Take 220 mg by mouth daily as needed.   Omega-3 Fatty Acids (FISH OIL) 1200 MG CAPS Take 1 capsule by mouth daily.   SYNTHROID 112 MCG tablet Take 112 mcg by mouth. Every morning before breakfast and 1/2 tablet on Wednesday and Sunday   telmisartan (MICARDIS) 80 MG tablet Take 80 mg by mouth daily.   VITAMIN D PO Take 1 tablet by mouth daily.  Allergies:   Penicillins   Social History   Socioeconomic History   Marital status: Married    Spouse name: Not on file   Number of children: 2   Years of education: Not on file   Highest education level: Not on file  Occupational History   Occupation: retired   Tobacco Use   Smoking status: Never   Smokeless tobacco: Never  Vaping Use   Vaping Use: Never used  Substance and Sexual Activity   Alcohol use: No   Drug use: No   Sexual activity: Not on file  Other Topics Concern   Not on file  Social History Narrative   Not on file    Social Determinants of Health   Financial Resource Strain: Not on file  Food Insecurity: Not on file  Transportation Needs: Not on file  Physical Activity: Not on file  Stress: Not on file  Social Connections: Not on file     Family History: The patient's family history includes Addison's disease in her sister; CAD in her mother; COPD in her sister; Diabetes in her brother; Heart failure in her mother. There is no history of Colon cancer, Esophageal cancer, Rectal cancer, or Stomach cancer.  ROS:   Please see the history of present illness.     All other systems reviewed and are negative.  EKGs/Labs/Other Studies Reviewed:    The following studies were reviewed today:   EKG:   01/20/2021: Normal sinus rhythm, biatrial enlargement, rate 80, no ST abnormality 03/30/2022: Normal sinus rhythm, rate 80, left atrial enlargement, no ST abnormality  TTE 10/31/13: - Left ventricle: The cavity size was normal. Systolic function was    vigorous. The estimated ejection fraction was in the range of 65%    to 70%. Wall motion was normal; there were no regional wall    motion abnormalities. Doppler parameters are consistent with    abnormal left ventricular relaxation (grade 1 diastolic    dysfunction). Doppler parameters are consistent with elevated    ventricular end-diastolic filling pressure.  - Aortic valve: Trileaflet; mildly thickened leaflets.    Transvalvular velocity was within the normal range. There was no    stenosis. There was mild regurgitation.  - Aortic root: The aortic root was normal in size.  - Mitral valve: Severe mitral annular calcification, predominantly    posterior.  - Left atrium: The atrium was mildly dilated.  - Right ventricle: Systolic function was normal.  - Right atrium: The atrium was normal in size.  - Pulmonary arteries: Systolic pressure was within the normal    range.  - Pericardium, extracardiac: There was no pericardial effusion.    Impressions:   - Normal biventricular size and systolic function.    Abnormal relaxation with elevated filling pressures.    Mild aortic regurgitation.   Cath 12/30/15: 1. Moderate small vessel coronary artery disease involving the first diagonal and RCA subbranches. 2. Widely patent RCA, LAD, left main, and left circumflex vessels 3. Normal LVEDP 4. Heavily calcified mitral annulus   Recommendations: Medical therapy   Recent Labs: No results found for requested labs within last 365 days.  Recent Lipid Panel    Component Value Date/Time   CHOL 128 01/20/2021 1416   TRIG 62 01/20/2021 1416   HDL 83 01/20/2021 1416   CHOLHDL 1.5 01/20/2021 1416   LDLCALC 32 01/20/2021 1416    Physical Exam:    VS:  BP (!) 132/56 (BP Location: Left Arm, Patient Position: Sitting, Cuff Size: Normal)  Pulse 80   Ht 5\' 3"  (1.6 m)   Wt 129 lb 3.2 oz (58.6 kg)   SpO2 98%   BMI 22.89 kg/m     Wt Readings from Last 3 Encounters:  03/30/22 129 lb 3.2 oz (58.6 kg)  01/20/21 121 lb 9.6 oz (55.2 kg)  01/22/20 127 lb 9.6 oz (57.9 kg)     GEN:   in no acute distress HEENT: Normal NECK: No JVD CARDIAC: RRR, no murmurs, rubs, gallops RESPIRATORY:  Clear to auscultation without rales, wheezing or rhonchi  ABDOMEN: Soft, non-tender, non-distended MUSCULOSKELETAL:  No edema; No deformity  SKIN: Warm and dry NEUROLOGIC:  Alert and oriented x 3 PSYCHIATRIC:  Normal affect   ASSESSMENT:    1. Coronary artery disease involving native coronary artery of native heart without angina pectoris   2. Aortic valve insufficiency, etiology of cardiac valve disease unspecified   3. Pain in both lower extremities   4. Hyperlipidemia, unspecified hyperlipidemia type   5. Essential hypertension      PLAN:    CAD: Cath on 12/30/2015 showed moderate small vessel CAD involving the first diagonal and RCA subbranches.  Normal LV systolic function on TTE in 2015.  Having shortness of breath with minimal  exertion  TTE on 06/29/2019 showed normal biventricular function, mild mitral stenosis, mild aortic regurgitation.  Lexiscan Myoview on 06/22/2019 showed normal perfusion.  Currently denies any anginal symptoms -Continue aspirin 81 mg daily. -Continue atorvastatin 80 mg daily.  LDL 38 on 09/24/2021  Aortic regurgitation: Mild on TTE 06/29/19.  Will monitor, plan repeat echo next year  Hyperlipidemia: On atorvastatin 80 mg daily.  LDL 38 on 09/24/2021  Hypertension: On telmisartan 80 mg daily and amlodipine 10 mg daily.  Appears controlled   Type 2 diabetes: On insulin.  A1c 7.4% on 02/09/2022  Leg pain: Check ABIs  RTC in 1 year  Medication Adjustments/Labs and Tests Ordered: Current medicines are reviewed at length with the patient today.  Concerns regarding medicines are outlined above.  Orders Placed This Encounter  Procedures   EKG 12-Lead   ECHOCARDIOGRAM COMPLETE   VAS 14/07/2021 ABI WITH/WO TBI   VAS [place] LOWER EXTREMITY ARTERIAL DUPLEX    No orders of the defined types were placed in this encounter.    Patient Instructions  Medication Instructions:  No changes *If you need a refill on your cardiac medications before your next appointment, please call your pharmacy*   Lab Work: None If you have labs (blood work) drawn today and your tests are completely normal, you will receive your results only by: MyChart Message (if you have MyChart) OR A paper copy in the mail If you have any lab test that is abnormal or we need to change your treatment, we will call you to review the results.   Testing/Procedures: Your physician has requested that you have an ankle brachial index (ABI). During this test an ultrasound and blood pressure cuff are used to evaluate the arteries that supply the arms and legs with blood. Allow thirty minutes for this exam. There are no restrictions or special instructions.  Your physician has requested that you have a lower extremity arterial duplex. This test is  an ultrasound of the arteries in the legs or arms. It looks at arterial blood flow in the legs and arms. Allow one hour for Lower and Upper Arterial scans. There are no restrictions or special instructions    IN ONE YEAR: Your physician has requested that you have an echocardiogram.  Echocardiography is a painless test that uses sound waves to create images of your heart. It provides your doctor with information about the size and shape of your heart and how well your heart's chambers and valves are working. This procedure takes approximately one hour. There are no restrictions for this procedure. Please do NOT wear cologne, perfume, aftershave, or lotions (deodorant is allowed). Please arrive 15 minutes prior to your appointment time.    Follow-Up: At [facility], you and your health needs are our priority.  As part of our continuing mission to provide you with exceptional heart care, we have created designated Provider Care Teams.  These Care Teams include your primary Cardiologist (physician) and Advanced Practice Providers (APPs -  Physician Assistants and Nurse Practitioners) who all work together to provide you with the care you need, when you need it.  We recommend signing up for the patient portal called "MyChart".  Sign up information is provided on this After Visit Summary.  MyChart is used to connect with patients for Virtual Visits (Telemedicine).  Patients are able to view lab/test results, encounter notes, upcoming appointments, etc.  Non-urgent messages can be sent to your provider as well.   To learn more about what you can do with MyChart, go to [email or web address].    Your next appointment:   1 year(s) after Echo  Provider:   Little Ishikawa, MD        Signed, Little Ishikawa, MD  03/30/2022 5:57 PM    [facility]

## 2022-04-01 DIAGNOSIS — E1022 Type 1 diabetes mellitus with diabetic chronic kidney disease: Secondary | ICD-10-CM | POA: Diagnosis not present

## 2022-04-13 ENCOUNTER — Other Ambulatory Visit: Payer: Self-pay | Admitting: Cardiology

## 2022-04-13 DIAGNOSIS — M79604 Pain in right leg: Secondary | ICD-10-CM

## 2022-04-13 DIAGNOSIS — I10 Essential (primary) hypertension: Secondary | ICD-10-CM

## 2022-04-13 DIAGNOSIS — R0981 Nasal congestion: Secondary | ICD-10-CM | POA: Diagnosis not present

## 2022-04-13 DIAGNOSIS — U071 COVID-19: Secondary | ICD-10-CM | POA: Diagnosis not present

## 2022-04-13 DIAGNOSIS — I2510 Atherosclerotic heart disease of native coronary artery without angina pectoris: Secondary | ICD-10-CM

## 2022-04-13 DIAGNOSIS — R5383 Other fatigue: Secondary | ICD-10-CM | POA: Diagnosis not present

## 2022-04-13 DIAGNOSIS — Z1152 Encounter for screening for COVID-19: Secondary | ICD-10-CM | POA: Diagnosis not present

## 2022-04-13 DIAGNOSIS — I351 Nonrheumatic aortic (valve) insufficiency: Secondary | ICD-10-CM

## 2022-04-13 DIAGNOSIS — E785 Hyperlipidemia, unspecified: Secondary | ICD-10-CM

## 2022-04-13 DIAGNOSIS — E1022 Type 1 diabetes mellitus with diabetic chronic kidney disease: Secondary | ICD-10-CM | POA: Diagnosis not present

## 2022-04-13 DIAGNOSIS — I739 Peripheral vascular disease, unspecified: Secondary | ICD-10-CM

## 2022-04-13 DIAGNOSIS — R059 Cough, unspecified: Secondary | ICD-10-CM | POA: Diagnosis not present

## 2022-04-13 DIAGNOSIS — J209 Acute bronchitis, unspecified: Secondary | ICD-10-CM | POA: Diagnosis not present

## 2022-04-14 ENCOUNTER — Ambulatory Visit (HOSPITAL_COMMUNITY)
Admission: RE | Admit: 2022-04-14 | Payer: Medicare PPO | Source: Ambulatory Visit | Attending: Cardiology | Admitting: Cardiology

## 2022-04-27 ENCOUNTER — Ambulatory Visit (HOSPITAL_COMMUNITY)
Admission: RE | Admit: 2022-04-27 | Discharge: 2022-04-27 | Disposition: A | Discharge status: Home / Self Care | Payer: Medicare PPO | Source: Ambulatory Visit | Attending: Cardiology | Admitting: Cardiology

## 2022-04-27 DIAGNOSIS — N1831 Chronic kidney disease, stage 3a: Secondary | ICD-10-CM | POA: Diagnosis not present

## 2022-04-27 DIAGNOSIS — Z794 Long term (current) use of insulin: Secondary | ICD-10-CM | POA: Diagnosis not present

## 2022-04-27 DIAGNOSIS — I129 Hypertensive chronic kidney disease with stage 1 through stage 4 chronic kidney disease, or unspecified chronic kidney disease: Secondary | ICD-10-CM | POA: Diagnosis not present

## 2022-04-27 DIAGNOSIS — M79605 Pain in left leg: Secondary | ICD-10-CM | POA: Diagnosis not present

## 2022-04-27 DIAGNOSIS — I739 Peripheral vascular disease, unspecified: Secondary | ICD-10-CM | POA: Diagnosis not present

## 2022-04-27 DIAGNOSIS — M79604 Pain in right leg: Secondary | ICD-10-CM | POA: Diagnosis not present

## 2022-04-27 DIAGNOSIS — I2510 Atherosclerotic heart disease of native coronary artery without angina pectoris: Secondary | ICD-10-CM | POA: Diagnosis not present

## 2022-04-27 DIAGNOSIS — E1022 Type 1 diabetes mellitus with diabetic chronic kidney disease: Secondary | ICD-10-CM | POA: Diagnosis not present

## 2022-04-27 LAB — VAS US ABI WITH/WO TBI

## 2022-05-02 DIAGNOSIS — E1022 Type 1 diabetes mellitus with diabetic chronic kidney disease: Secondary | ICD-10-CM | POA: Diagnosis not present

## 2022-05-26 ENCOUNTER — Other Ambulatory Visit: Payer: Self-pay | Admitting: Cardiology

## 2022-05-31 DIAGNOSIS — E1022 Type 1 diabetes mellitus with diabetic chronic kidney disease: Secondary | ICD-10-CM | POA: Diagnosis not present

## 2022-06-10 DIAGNOSIS — E209 Hypoparathyroidism, unspecified: Secondary | ICD-10-CM | POA: Diagnosis not present

## 2022-06-10 DIAGNOSIS — I6523 Occlusion and stenosis of bilateral carotid arteries: Secondary | ICD-10-CM | POA: Diagnosis not present

## 2022-06-10 DIAGNOSIS — E310 Autoimmune polyglandular failure: Secondary | ICD-10-CM | POA: Diagnosis not present

## 2022-06-10 DIAGNOSIS — E1022 Type 1 diabetes mellitus with diabetic chronic kidney disease: Secondary | ICD-10-CM | POA: Diagnosis not present

## 2022-06-10 DIAGNOSIS — N1831 Chronic kidney disease, stage 3a: Secondary | ICD-10-CM | POA: Diagnosis not present

## 2022-06-10 DIAGNOSIS — E890 Postprocedural hypothyroidism: Secondary | ICD-10-CM | POA: Diagnosis not present

## 2022-06-10 DIAGNOSIS — I2510 Atherosclerotic heart disease of native coronary artery without angina pectoris: Secondary | ICD-10-CM | POA: Diagnosis not present

## 2022-06-10 DIAGNOSIS — E103393 Type 1 diabetes mellitus with moderate nonproliferative diabetic retinopathy without macular edema, bilateral: Secondary | ICD-10-CM | POA: Diagnosis not present

## 2022-06-10 DIAGNOSIS — I10 Essential (primary) hypertension: Secondary | ICD-10-CM | POA: Diagnosis not present

## 2022-07-01 DIAGNOSIS — E1022 Type 1 diabetes mellitus with diabetic chronic kidney disease: Secondary | ICD-10-CM | POA: Diagnosis not present

## 2022-07-31 DIAGNOSIS — E1022 Type 1 diabetes mellitus with diabetic chronic kidney disease: Secondary | ICD-10-CM | POA: Diagnosis not present

## 2022-08-24 DIAGNOSIS — Z794 Long term (current) use of insulin: Secondary | ICD-10-CM | POA: Diagnosis not present

## 2022-08-24 DIAGNOSIS — N1831 Chronic kidney disease, stage 3a: Secondary | ICD-10-CM | POA: Diagnosis not present

## 2022-08-24 DIAGNOSIS — I2510 Atherosclerotic heart disease of native coronary artery without angina pectoris: Secondary | ICD-10-CM | POA: Diagnosis not present

## 2022-08-24 DIAGNOSIS — I129 Hypertensive chronic kidney disease with stage 1 through stage 4 chronic kidney disease, or unspecified chronic kidney disease: Secondary | ICD-10-CM | POA: Diagnosis not present

## 2022-08-24 DIAGNOSIS — E1022 Type 1 diabetes mellitus with diabetic chronic kidney disease: Secondary | ICD-10-CM | POA: Diagnosis not present

## 2022-08-31 DIAGNOSIS — E1022 Type 1 diabetes mellitus with diabetic chronic kidney disease: Secondary | ICD-10-CM | POA: Diagnosis not present

## 2022-09-27 DIAGNOSIS — Z1231 Encounter for screening mammogram for malignant neoplasm of breast: Secondary | ICD-10-CM | POA: Diagnosis not present

## 2022-09-27 DIAGNOSIS — Z6822 Body mass index (BMI) 22.0-22.9, adult: Secondary | ICD-10-CM | POA: Diagnosis not present

## 2022-09-27 DIAGNOSIS — Z01419 Encounter for gynecological examination (general) (routine) without abnormal findings: Secondary | ICD-10-CM | POA: Diagnosis not present

## 2022-09-30 DIAGNOSIS — E1022 Type 1 diabetes mellitus with diabetic chronic kidney disease: Secondary | ICD-10-CM | POA: Diagnosis not present

## 2022-10-08 DIAGNOSIS — E890 Postprocedural hypothyroidism: Secondary | ICD-10-CM | POA: Diagnosis not present

## 2022-10-08 DIAGNOSIS — Z794 Long term (current) use of insulin: Secondary | ICD-10-CM | POA: Diagnosis not present

## 2022-10-08 DIAGNOSIS — E785 Hyperlipidemia, unspecified: Secondary | ICD-10-CM | POA: Diagnosis not present

## 2022-10-08 DIAGNOSIS — B538 Other malaria, not elsewhere classified: Secondary | ICD-10-CM | POA: Diagnosis not present

## 2022-10-08 DIAGNOSIS — E1022 Type 1 diabetes mellitus with diabetic chronic kidney disease: Secondary | ICD-10-CM | POA: Diagnosis not present

## 2022-10-08 DIAGNOSIS — E538 Deficiency of other specified B group vitamins: Secondary | ICD-10-CM | POA: Diagnosis not present

## 2022-10-15 DIAGNOSIS — E209 Hypoparathyroidism, unspecified: Secondary | ICD-10-CM | POA: Diagnosis not present

## 2022-10-15 DIAGNOSIS — Z1331 Encounter for screening for depression: Secondary | ICD-10-CM | POA: Diagnosis not present

## 2022-10-15 DIAGNOSIS — I2510 Atherosclerotic heart disease of native coronary artery without angina pectoris: Secondary | ICD-10-CM | POA: Diagnosis not present

## 2022-10-15 DIAGNOSIS — N1831 Chronic kidney disease, stage 3a: Secondary | ICD-10-CM | POA: Diagnosis not present

## 2022-10-15 DIAGNOSIS — Z1339 Encounter for screening examination for other mental health and behavioral disorders: Secondary | ICD-10-CM | POA: Diagnosis not present

## 2022-10-15 DIAGNOSIS — E890 Postprocedural hypothyroidism: Secondary | ICD-10-CM | POA: Diagnosis not present

## 2022-10-15 DIAGNOSIS — I129 Hypertensive chronic kidney disease with stage 1 through stage 4 chronic kidney disease, or unspecified chronic kidney disease: Secondary | ICD-10-CM | POA: Diagnosis not present

## 2022-10-15 DIAGNOSIS — E538 Deficiency of other specified B group vitamins: Secondary | ICD-10-CM | POA: Diagnosis not present

## 2022-10-15 DIAGNOSIS — I739 Peripheral vascular disease, unspecified: Secondary | ICD-10-CM | POA: Diagnosis not present

## 2022-10-15 DIAGNOSIS — E1022 Type 1 diabetes mellitus with diabetic chronic kidney disease: Secondary | ICD-10-CM | POA: Diagnosis not present

## 2022-10-15 DIAGNOSIS — Z0000 Encounter for general adult medical examination without abnormal findings: Secondary | ICD-10-CM | POA: Diagnosis not present

## 2022-10-20 ENCOUNTER — Encounter (INDEPENDENT_AMBULATORY_CARE_PROVIDER_SITE_OTHER): Payer: Medicare PPO | Admitting: Ophthalmology

## 2022-10-20 DIAGNOSIS — H353131 Nonexudative age-related macular degeneration, bilateral, early dry stage: Secondary | ICD-10-CM | POA: Diagnosis not present

## 2022-10-20 DIAGNOSIS — E103393 Type 1 diabetes mellitus with moderate nonproliferative diabetic retinopathy without macular edema, bilateral: Secondary | ICD-10-CM | POA: Diagnosis not present

## 2022-10-20 DIAGNOSIS — H43813 Vitreous degeneration, bilateral: Secondary | ICD-10-CM | POA: Diagnosis not present

## 2022-10-21 DIAGNOSIS — K130 Diseases of lips: Secondary | ICD-10-CM | POA: Diagnosis not present

## 2022-10-21 DIAGNOSIS — L309 Dermatitis, unspecified: Secondary | ICD-10-CM | POA: Diagnosis not present

## 2022-10-31 DIAGNOSIS — E1022 Type 1 diabetes mellitus with diabetic chronic kidney disease: Secondary | ICD-10-CM | POA: Diagnosis not present

## 2022-12-01 DIAGNOSIS — E1022 Type 1 diabetes mellitus with diabetic chronic kidney disease: Secondary | ICD-10-CM | POA: Diagnosis not present

## 2022-12-16 DIAGNOSIS — Z794 Long term (current) use of insulin: Secondary | ICD-10-CM | POA: Diagnosis not present

## 2022-12-16 DIAGNOSIS — I129 Hypertensive chronic kidney disease with stage 1 through stage 4 chronic kidney disease, or unspecified chronic kidney disease: Secondary | ICD-10-CM | POA: Diagnosis not present

## 2022-12-16 DIAGNOSIS — Z23 Encounter for immunization: Secondary | ICD-10-CM | POA: Diagnosis not present

## 2022-12-16 DIAGNOSIS — E1022 Type 1 diabetes mellitus with diabetic chronic kidney disease: Secondary | ICD-10-CM | POA: Diagnosis not present

## 2022-12-16 DIAGNOSIS — N1831 Chronic kidney disease, stage 3a: Secondary | ICD-10-CM | POA: Diagnosis not present

## 2022-12-16 DIAGNOSIS — I2510 Atherosclerotic heart disease of native coronary artery without angina pectoris: Secondary | ICD-10-CM | POA: Diagnosis not present

## 2022-12-31 DIAGNOSIS — E1022 Type 1 diabetes mellitus with diabetic chronic kidney disease: Secondary | ICD-10-CM | POA: Diagnosis not present

## 2023-01-31 DIAGNOSIS — E1022 Type 1 diabetes mellitus with diabetic chronic kidney disease: Secondary | ICD-10-CM | POA: Diagnosis not present

## 2023-02-18 DIAGNOSIS — I129 Hypertensive chronic kidney disease with stage 1 through stage 4 chronic kidney disease, or unspecified chronic kidney disease: Secondary | ICD-10-CM | POA: Diagnosis not present

## 2023-02-18 DIAGNOSIS — E310 Autoimmune polyglandular failure: Secondary | ICD-10-CM | POA: Diagnosis not present

## 2023-02-18 DIAGNOSIS — E1022 Type 1 diabetes mellitus with diabetic chronic kidney disease: Secondary | ICD-10-CM | POA: Diagnosis not present

## 2023-02-18 DIAGNOSIS — E890 Postprocedural hypothyroidism: Secondary | ICD-10-CM | POA: Diagnosis not present

## 2023-02-18 DIAGNOSIS — J449 Chronic obstructive pulmonary disease, unspecified: Secondary | ICD-10-CM | POA: Diagnosis not present

## 2023-02-18 DIAGNOSIS — E785 Hyperlipidemia, unspecified: Secondary | ICD-10-CM | POA: Diagnosis not present

## 2023-02-18 DIAGNOSIS — N1831 Chronic kidney disease, stage 3a: Secondary | ICD-10-CM | POA: Diagnosis not present

## 2023-02-18 DIAGNOSIS — I739 Peripheral vascular disease, unspecified: Secondary | ICD-10-CM | POA: Diagnosis not present

## 2023-02-22 DIAGNOSIS — B351 Tinea unguium: Secondary | ICD-10-CM | POA: Diagnosis not present

## 2023-02-22 DIAGNOSIS — L80 Vitiligo: Secondary | ICD-10-CM | POA: Diagnosis not present

## 2023-02-22 DIAGNOSIS — L821 Other seborrheic keratosis: Secondary | ICD-10-CM | POA: Diagnosis not present

## 2023-02-22 DIAGNOSIS — L570 Actinic keratosis: Secondary | ICD-10-CM | POA: Diagnosis not present

## 2023-02-22 DIAGNOSIS — L814 Other melanin hyperpigmentation: Secondary | ICD-10-CM | POA: Diagnosis not present

## 2023-02-22 DIAGNOSIS — R202 Paresthesia of skin: Secondary | ICD-10-CM | POA: Diagnosis not present

## 2023-02-22 DIAGNOSIS — D225 Melanocytic nevi of trunk: Secondary | ICD-10-CM | POA: Diagnosis not present

## 2023-03-02 DIAGNOSIS — E1022 Type 1 diabetes mellitus with diabetic chronic kidney disease: Secondary | ICD-10-CM | POA: Diagnosis not present

## 2023-03-16 ENCOUNTER — Ambulatory Visit (HOSPITAL_COMMUNITY): Payer: Medicare PPO | Attending: Cardiology

## 2023-03-16 DIAGNOSIS — I351 Nonrheumatic aortic (valve) insufficiency: Secondary | ICD-10-CM

## 2023-03-16 LAB — ECHOCARDIOGRAM COMPLETE
Area-P 1/2: 3.9 cm2
MV VTI: 1.18 cm2
P 1/2 time: 828 ms
S' Lateral: 2.9 cm

## 2023-03-20 NOTE — Progress Notes (Signed)
 Cardiology Office Note:    Date:  03/21/2023   ID:  Abigail Green, DOB [birth year], MRN [id number]  PCP:  Nichole Senior, MD  Cardiologist:  [name], MD  Electrophysiologist:  None   Referring MD: Nichole Senior, MD   Chief Complaint  Patient presents with   Cardiac Valve Problem    History of Present Illness:    Abigail Green is a 78 y.o. female with a hx of type 1 diabetes, hypertension, hyperlipidemia who presents for follow-up.  She was referred by Dr. Nichole for evaluation of coronary artery disease, initially seen on 06/20/2019.  She reported rare chest pain.   TTE on 10/31/2013 showed LVEF 65 to 70%, grade 1 diastolic dysfunction, mild AI, severe mitral annular calcification, normal RV function.  Cath on 12/30/2015 showed moderate small vessel CAD involving the first diagonal and RCA subbranches.  TTE on 06/29/2019 showed normal biventricular function, mild mitral stenosis, mild aortic regurgitation.  Lexiscan  Myoview  on 06/22/2019 showed normal perfusion.  Echocardiogram 03/16/2023 showed EF 60 to 65%, grade 2 diastolic dysfunction, normal RV function, severe mitral stenosis (mean gradient 12 mmHg, MVA 1.2 cm by continuity equation).  Since last clinic visit, she reports she has been having dyspnea with exertion, especially walking upstairs.  She has not been exercising, most exertion she does now on short walks with her dog.  She denies any chest pain.  Denies any lightheadedness, syncope, or palpitations.  Has noted some lower extremity swelling.   Past Medical History:  Diagnosis Date   Cataract    removed bilaterally   Diabetes mellitus type 1 (HCC) 12/29/2015   Essential hypertension 12/29/2015   Family history of premature coronary artery disease 12/29/2015   Hyperlipidemia 12/29/2015   Hypoparathyroidism (HCC)     Past Surgical History:  Procedure Laterality Date   BREAST CYST EXCISION Left    CARDIAC CATHETERIZATION N/A 12/30/2015   Procedure: Left  Heart Cath and Coronary Angiography;  Surgeon: Ozell Fell, MD;  Location: [facility] INVASIVE CV LAB;  Service: Cardiovascular;  Laterality: N/A;   CATARACT EXTRACTION, BILATERAL     CESAREAN SECTION     x 2   THYROIDECTOMY     TRIGGER FINGER RELEASE Right    Thumb, A1 pully and incidental synovectomy of superficialis profundus tendons   TRIGGER FINGER RELEASE Left     Current Medications: Current Meds  Medication Sig   amLODipine  (NORVASC ) 5 MG tablet Take 1 tablet (5 mg total) by mouth daily.   aspirin  EC 81 MG tablet Take 81 mg by mouth daily.   atorvastatin (LIPITOR) 80 MG tablet Take 80 mg by mouth every evening.   Calcium Carbonate (CALCIUM 600 PO) Take 2 tablets by mouth in the morning and at bedtime.   Continuous Blood Gluc Sensor (FREESTYLE LIBRE 2 SENSOR) MISC as directed topically change every 14 days to monitor blood glucose continuously   insulin  aspart (NOVOLOG  FLEXPEN) 100 UNIT/ML FlexPen Inject 10-12 Units into the skin 3 (three) times daily with meals.   Insulin  Glargine (LANTUS Rainier) Inject 33 Units into the skin in the morning.   metoprolol  succinate (TOPROL  XL) 25 MG 24 hr tablet Take 1 tablet (25 mg total) by mouth daily.   Multiple Vitamin (MULTIVITAMIN) capsule Take 1 capsule by mouth daily.   naproxen sodium (ALEVE) 220 MG tablet Take 220 mg by mouth daily as needed (Headache).   Omega-3 Fatty Acids (FISH OIL) 1200 MG CAPS Take 1,200 mg by mouth daily.   SYNTHROID 112 MCG  tablet Take 56-112 mcg by mouth See admin instructions. Take 112 mcg every morning before breakfast and 56 mcg tablet on Wednesday and Sunday   telmisartan (MICARDIS) 80 MG tablet Take 80 mg by mouth daily.   [DISCONTINUED] amLODipine  (NORVASC ) 10 MG tablet TAKE 1 TABLET BY MOUTH DAILY   [DISCONTINUED] Multiple Vitamins-Minerals (ZINC PO) Take 1 tablet by mouth daily.   [DISCONTINUED] VITAMIN D PO Take 1 tablet by mouth daily.     Allergies:   Penicillins   Social History   Socioeconomic History    Marital status: Married    Spouse name: Not on file   Number of children: 2   Years of education: Not on file   Highest education level: Not on file  Occupational History   Occupation: retired   Tobacco Use   Smoking status: Never   Smokeless tobacco: Never  Vaping Use   Vaping status: Never Used  Substance and Sexual Activity   Alcohol  use: No   Drug use: No   Sexual activity: Not on file  Other Topics Concern   Not on file  Social History Narrative   Not on file   Social Drivers of Health   Financial Resource Strain: Not on file  Food Insecurity: Not on file  Transportation Needs: Not on file  Physical Activity: Not on file  Stress: Not on file  Social Connections: Not on file     Family History: The patient's family history includes Addison's disease in her sister; CAD in her mother; COPD in her sister; Diabetes in her brother; Heart failure in her mother. There is no history of Colon cancer, Esophageal cancer, Rectal cancer, or Stomach cancer.  ROS:   Please see the history of present illness.     All other systems reviewed and are negative.  EKGs/Labs/Other Studies Reviewed:    The following studies were reviewed today:   EKG:   01/20/2021: Normal sinus rhythm, biatrial enlargement, rate 80, no ST abnormality 03/30/2022: Normal sinus rhythm, rate 80, left atrial enlargement, no ST abnormality 03/21/2023: Normal sinus rhythm, rate 86, biatrial enlargement  TTE 10/31/13: - Left ventricle: The cavity size was normal. Systolic function was    vigorous. The estimated ejection fraction was in the range of 65%    to 70%. Wall motion was normal; there were no regional wall    motion abnormalities. Doppler parameters are consistent with    abnormal left ventricular relaxation (grade 1 diastolic    dysfunction). Doppler parameters are consistent with elevated    ventricular end-diastolic filling pressure.  - Aortic valve: Trileaflet; mildly thickened leaflets.     Transvalvular velocity was within the normal range. There was no    stenosis. There was mild regurgitation.  - Aortic root: The aortic root was normal in size.  - Mitral valve: Severe mitral annular calcification, predominantly    posterior.  - Left atrium: The atrium was mildly dilated.  - Right ventricle: Systolic function was normal.  - Right atrium: The atrium was normal in size.  - Pulmonary arteries: Systolic pressure was within the normal    range.  - Pericardium, extracardiac: There was no pericardial effusion.   Impressions:   - Normal biventricular size and systolic function.    Abnormal relaxation with elevated filling pressures.    Mild aortic regurgitation.   Cath 12/30/15: 1. Moderate small vessel coronary artery disease involving the first diagonal and RCA subbranches. 2. Widely patent RCA, LAD, left main, and left circumflex vessels 3. Normal  LVEDP 4. Heavily calcified mitral annulus   Recommendations: Medical therapy   Recent Labs: No results found for requested labs within last 365 days.  Recent Lipid Panel    Component Value Date/Time   CHOL 128 01/20/2021 1416   TRIG 62 01/20/2021 1416   HDL 83 01/20/2021 1416   CHOLHDL 1.5 01/20/2021 1416   LDLCALC 32 01/20/2021 1416    Physical Exam:    VS:  BP (!) 138/40 (BP Location: Left Arm, Patient Position: Sitting)   Pulse 86   Ht 5' 3 (1.6 m)   Wt 124 lb (56.2 kg)   SpO2 98%   BMI 21.97 kg/m     Wt Readings from Last 3 Encounters:  03/21/23 124 lb (56.2 kg)  03/30/22 129 lb 3.2 oz (58.6 kg)  01/20/21 121 lb 9.6 oz (55.2 kg)     GEN:   in no acute distress HEENT: Normal NECK: No JVD CARDIAC: RRR, no murmurs, rubs, gallops RESPIRATORY:  Clear to auscultation without rales, wheezing or rhonchi  ABDOMEN: Soft, non-tender, non-distended MUSCULOSKELETAL:  No edema; No deformity  SKIN: Warm and dry NEUROLOGIC:  Alert and oriented x 3 PSYCHIATRIC:  Normal affect   ASSESSMENT:    1. Mitral  valve stenosis, unspecified etiology   2. DOE (dyspnea on exertion)   3. Coronary artery disease involving native coronary artery of native heart, unspecified whether angina present   4. Essential hypertension   5. Hyperlipidemia, unspecified hyperlipidemia type       PLAN:    Mitral stenosis: Echocardiogram 03/16/2023 showed EF 60 to 65%, grade 2 diastolic dysfunction, normal RV function, severe mitral stenosis (mean gradient 12 mmHg, MVA 1.2 cm by continuity equation). -Echocardiogram suggests severe calcific mitral stenosis.  She is reporting dyspnea on exertion, suspect may be coming from her mitral stenosis.  Will start Toprol -XL 25 mg daily to decrease heart rate to improve gradients.  Check RHC/LHC for further evaluation of mitral stenosis as well as to evaluate for obstructive CAD.  If she is remaining symptomatic despite medical management, would plan referral to cardiac surgery -Risks and benefits of cardiac catheterization have been discussed with the patient.  These include bleeding, infection, kidney damage, stroke, heart attack, death.  The patient understands these risks and is willing to proceed.   CAD: Cath on 12/30/2015 showed moderate small vessel CAD involving the first diagonal and RCA subbranches.  Normal LV systolic function on TTE in 2015.  Having shortness of breath with minimal exertion  TTE on 06/29/2019 showed normal biventricular function, mild mitral stenosis, mild aortic regurgitation.  Lexiscan  Myoview  on 06/22/2019 showed normal perfusion.   -Continue aspirin  81 mg daily. -Continue atorvastatin 80 mg daily.  LDL 38 on 09/24/2021 -Reporting worsening dyspnea on exertion, echo with severe mitral stenosis, plan RHC/LHC as above  Aortic regurgitation: Mild on TTE 06/29/19.  Trivial on echo 03/16/2023  Hyperlipidemia: On atorvastatin 80 mg daily.  LDL 39 on 10/08/2022  Hypertension: On telmisartan 80 mg daily and amlodipine  10 mg daily.  Will decrease amlodipine  to 5 mg  daily with adding Toprol -XL 25 mg daily as above  Type 1 diabetes: On insulin .  A1c 8.0% on 02/18/2023  Leg pain: Arterial duplex 04/2022 showed heavily calcified vessels without evidence of significant stenosis  RTC in 1 month  Medication Adjustments/Labs and Tests Ordered: Current medicines are reviewed at length with the patient today.  Concerns regarding medicines are outlined above.  Orders Placed This Encounter  Procedures   Basic Metabolic Panel (BMET)  CBC w/Diff/Platelet   EKG 12-Lead   LEFT AND RIGHT HEART CATHETERIZATION WITH CORONARY ANGIOGRAM    Meds ordered this encounter  Medications   metoprolol  succinate (TOPROL  XL) 25 MG 24 hr tablet    Sig: Take 1 tablet (25 mg total) by mouth daily.    Dispense:  90 tablet    Refill:  3   amLODipine  (NORVASC ) 5 MG tablet    Sig: Take 1 tablet (5 mg total) by mouth daily.    Dispense:  90 tablet    Refill:  3     Patient Instructions  Medication Instructions:  Start Toprol  XL 25 mg daily Start Amlodipine  5mg   daily  Stop Amlodipine  10mg  daily  Continue all current medications *If you need a refill on your cardiac medications before your next appointment, please call your pharmacy*   Lab Work: BMET, CBC TODAY If you have labs (blood work) drawn today and your tests are completely normal, you will receive your results only by: MyChart Message (if you have MyChart) OR A paper copy in the mail If you have any lab test that is abnormal or we need to change your treatment, we will call you to review the results.   Testing/Procedures:   MEKENNA FINAU  03/21/2023  You are scheduled for a Cardiac Catheterization on Wednesday, January 15 with Dr. Ozell Fell.  1. Please arrive at the [facility] (Main Entrance A) at [facility]: [address] [place], [place] [address] at 12:30 PM (This time is 2 hour(s) before your procedure to ensure your preparation).   Free valet parking service is available. You  will check in at ADMITTING. The support person will be asked to wait in the waiting room.  It is OK to have someone drop you off and come back when you are ready to be discharged.    Special note: Every effort is made to have your procedure done on time. Please understand that emergencies sometimes delay scheduled procedures.  2. Diet: Do not eat solid foods after midnight.  The patient may have clear liquids until 5am upon the day of the procedure.  3. Labs: Bmet , cbc today 4. Medication instructions in preparation for your procedure:   Contrast Allergy: No  The night before your procedure take half of  Novolog  and half of lantus dose  The day of your procedure do not take Telmisartan 80 mg    On the morning of your procedure, take your Aspirin  81 mg and any morning medicines NOT listed above.  You may use sips of water.  5. Plan to go home the same day, you will only stay overnight if medically necessary. 6. Bring a current list of your medications and current insurance cards. 7. You MUST have a responsible person to drive you home. 8. Someone MUST be with you the first 24 hours after you arrive home or your discharge will be delayed. 9. Please wear clothes that are easy to get on and off and wear slip-on shoes.  Thank you for allowing us  to care for you!   -- [facility]    Follow-Up: At [facility], you and your health needs are our priority.  As part of our continuing mission to provide you with exceptional heart care, we have created designated Provider Care Teams.  These Care Teams include your primary Cardiologist (physician) and Advanced Practice Providers (APPs -  Physician Assistants and Nurse Practitioners) who all work together to  provide you with the care you need, when you need it.  We recommend signing up for the patient portal called MyChart.  Sign up information is provided on this After Visit Summary.  MyChart is used to  connect with patients for Virtual Visits (Telemedicine).  Patients are able to view lab/test results, encounter notes, upcoming appointments, etc.  Non-urgent messages can be sent to your provider as well.   To learn more about what you can do with MyChart, go to [email or web address].    Your next appointment:   1 month(s) please schedule on Feb 4 at [facility] office  Provider:   [name], MD     Other Instructions none         Signed, [name], MD  03/21/2023 1:22 PM    [facility]

## 2023-03-20 NOTE — H&P (View-Only) (Signed)
 Cardiology Office Note:    Date:  03/21/2023   ID:  Abigail Green, DOB [birth year], MRN [id number]  PCP:  Nichole Senior, MD  Cardiologist:  [name], MD  Electrophysiologist:  None   Referring MD: Nichole Senior, MD   Chief Complaint  Patient presents with   Cardiac Valve Problem    History of Present Illness:    Abigail Green is a 78 y.o. female with a hx of type 1 diabetes, hypertension, hyperlipidemia who presents for follow-up.  She was referred by Dr. Nichole for evaluation of coronary artery disease, initially seen on 06/20/2019.  She reported rare chest pain.   TTE on 10/31/2013 showed LVEF 65 to 70%, grade 1 diastolic dysfunction, mild AI, severe mitral annular calcification, normal RV function.  Cath on 12/30/2015 showed moderate small vessel CAD involving the first diagonal and RCA subbranches.  TTE on 06/29/2019 showed normal biventricular function, mild mitral stenosis, mild aortic regurgitation.  Lexiscan  Myoview  on 06/22/2019 showed normal perfusion.  Echocardiogram 03/16/2023 showed EF 60 to 65%, grade 2 diastolic dysfunction, normal RV function, severe mitral stenosis (mean gradient 12 mmHg, MVA 1.2 cm by continuity equation).  Since last clinic visit, she reports she has been having dyspnea with exertion, especially walking upstairs.  She has not been exercising, most exertion she does now on short walks with her dog.  She denies any chest pain.  Denies any lightheadedness, syncope, or palpitations.  Has noted some lower extremity swelling.   Past Medical History:  Diagnosis Date   Cataract    removed bilaterally   Diabetes mellitus type 1 (HCC) 12/29/2015   Essential hypertension 12/29/2015   Family history of premature coronary artery disease 12/29/2015   Hyperlipidemia 12/29/2015   Hypoparathyroidism (HCC)     Past Surgical History:  Procedure Laterality Date   BREAST CYST EXCISION Left    CARDIAC CATHETERIZATION N/A 12/30/2015   Procedure: Left  Heart Cath and Coronary Angiography;  Surgeon: Ozell Fell, MD;  Location: [facility] INVASIVE CV LAB;  Service: Cardiovascular;  Laterality: N/A;   CATARACT EXTRACTION, BILATERAL     CESAREAN SECTION     x 2   THYROIDECTOMY     TRIGGER FINGER RELEASE Right    Thumb, A1 pully and incidental synovectomy of superficialis profundus tendons   TRIGGER FINGER RELEASE Left     Current Medications: Current Meds  Medication Sig   amLODipine  (NORVASC ) 5 MG tablet Take 1 tablet (5 mg total) by mouth daily.   aspirin  EC 81 MG tablet Take 81 mg by mouth daily.   atorvastatin (LIPITOR) 80 MG tablet Take 80 mg by mouth every evening.   Calcium Carbonate (CALCIUM 600 PO) Take 2 tablets by mouth in the morning and at bedtime.   Continuous Blood Gluc Sensor (FREESTYLE LIBRE 2 SENSOR) MISC as directed topically change every 14 days to monitor blood glucose continuously   insulin  aspart (NOVOLOG  FLEXPEN) 100 UNIT/ML FlexPen Inject 10-12 Units into the skin 3 (three) times daily with meals.   Insulin  Glargine (LANTUS Rainier) Inject 33 Units into the skin in the morning.   metoprolol  succinate (TOPROL  XL) 25 MG 24 hr tablet Take 1 tablet (25 mg total) by mouth daily.   Multiple Vitamin (MULTIVITAMIN) capsule Take 1 capsule by mouth daily.   naproxen sodium (ALEVE) 220 MG tablet Take 220 mg by mouth daily as needed (Headache).   Omega-3 Fatty Acids (FISH OIL) 1200 MG CAPS Take 1,200 mg by mouth daily.   SYNTHROID 112 MCG  tablet Take 56-112 mcg by mouth See admin instructions. Take 112 mcg every morning before breakfast and 56 mcg tablet on Wednesday and Sunday   telmisartan (MICARDIS) 80 MG tablet Take 80 mg by mouth daily.   [DISCONTINUED] amLODipine  (NORVASC ) 10 MG tablet TAKE 1 TABLET BY MOUTH DAILY   [DISCONTINUED] Multiple Vitamins-Minerals (ZINC PO) Take 1 tablet by mouth daily.   [DISCONTINUED] VITAMIN D PO Take 1 tablet by mouth daily.     Allergies:   Penicillins   Social History   Socioeconomic History    Marital status: Married    Spouse name: Not on file   Number of children: 2   Years of education: Not on file   Highest education level: Not on file  Occupational History   Occupation: retired   Tobacco Use   Smoking status: Never   Smokeless tobacco: Never  Vaping Use   Vaping status: Never Used  Substance and Sexual Activity   Alcohol  use: No   Drug use: No   Sexual activity: Not on file  Other Topics Concern   Not on file  Social History Narrative   Not on file   Social Drivers of Health   Financial Resource Strain: Not on file  Food Insecurity: Not on file  Transportation Needs: Not on file  Physical Activity: Not on file  Stress: Not on file  Social Connections: Not on file     Family History: The patient's family history includes Addison's disease in her sister; CAD in her mother; COPD in her sister; Diabetes in her brother; Heart failure in her mother. There is no history of Colon cancer, Esophageal cancer, Rectal cancer, or Stomach cancer.  ROS:   Please see the history of present illness.     All other systems reviewed and are negative.  EKGs/Labs/Other Studies Reviewed:    The following studies were reviewed today:   EKG:   01/20/2021: Normal sinus rhythm, biatrial enlargement, rate 80, no ST abnormality 03/30/2022: Normal sinus rhythm, rate 80, left atrial enlargement, no ST abnormality 03/21/2023: Normal sinus rhythm, rate 86, biatrial enlargement  TTE 10/31/13: - Left ventricle: The cavity size was normal. Systolic function was    vigorous. The estimated ejection fraction was in the range of 65%    to 70%. Wall motion was normal; there were no regional wall    motion abnormalities. Doppler parameters are consistent with    abnormal left ventricular relaxation (grade 1 diastolic    dysfunction). Doppler parameters are consistent with elevated    ventricular end-diastolic filling pressure.  - Aortic valve: Trileaflet; mildly thickened leaflets.     Transvalvular velocity was within the normal range. There was no    stenosis. There was mild regurgitation.  - Aortic root: The aortic root was normal in size.  - Mitral valve: Severe mitral annular calcification, predominantly    posterior.  - Left atrium: The atrium was mildly dilated.  - Right ventricle: Systolic function was normal.  - Right atrium: The atrium was normal in size.  - Pulmonary arteries: Systolic pressure was within the normal    range.  - Pericardium, extracardiac: There was no pericardial effusion.   Impressions:   - Normal biventricular size and systolic function.    Abnormal relaxation with elevated filling pressures.    Mild aortic regurgitation.   Cath 12/30/15: 1. Moderate small vessel coronary artery disease involving the first diagonal and RCA subbranches. 2. Widely patent RCA, LAD, left main, and left circumflex vessels 3. Normal  LVEDP 4. Heavily calcified mitral annulus   Recommendations: Medical therapy   Recent Labs: No results found for requested labs within last 365 days.  Recent Lipid Panel    Component Value Date/Time   CHOL 128 01/20/2021 1416   TRIG 62 01/20/2021 1416   HDL 83 01/20/2021 1416   CHOLHDL 1.5 01/20/2021 1416   LDLCALC 32 01/20/2021 1416    Physical Exam:    VS:  BP (!) 138/40 (BP Location: Left Arm, Patient Position: Sitting)   Pulse 86   Ht 5' 3 (1.6 m)   Wt 124 lb (56.2 kg)   SpO2 98%   BMI 21.97 kg/m     Wt Readings from Last 3 Encounters:  03/21/23 124 lb (56.2 kg)  03/30/22 129 lb 3.2 oz (58.6 kg)  01/20/21 121 lb 9.6 oz (55.2 kg)     GEN:   in no acute distress HEENT: Normal NECK: No JVD CARDIAC: RRR, no murmurs, rubs, gallops RESPIRATORY:  Clear to auscultation without rales, wheezing or rhonchi  ABDOMEN: Soft, non-tender, non-distended MUSCULOSKELETAL:  No edema; No deformity  SKIN: Warm and dry NEUROLOGIC:  Alert and oriented x 3 PSYCHIATRIC:  Normal affect   ASSESSMENT:    1. Mitral  valve stenosis, unspecified etiology   2. DOE (dyspnea on exertion)   3. Coronary artery disease involving native coronary artery of native heart, unspecified whether angina present   4. Essential hypertension   5. Hyperlipidemia, unspecified hyperlipidemia type       PLAN:    Mitral stenosis: Echocardiogram 03/16/2023 showed EF 60 to 65%, grade 2 diastolic dysfunction, normal RV function, severe mitral stenosis (mean gradient 12 mmHg, MVA 1.2 cm by continuity equation). -Echocardiogram suggests severe calcific mitral stenosis.  She is reporting dyspnea on exertion, suspect may be coming from her mitral stenosis.  Will start Toprol -XL 25 mg daily to decrease heart rate to improve gradients.  Check RHC/LHC for further evaluation of mitral stenosis as well as to evaluate for obstructive CAD.  If she is remaining symptomatic despite medical management, would plan referral to cardiac surgery -Risks and benefits of cardiac catheterization have been discussed with the patient.  These include bleeding, infection, kidney damage, stroke, heart attack, death.  The patient understands these risks and is willing to proceed.   CAD: Cath on 12/30/2015 showed moderate small vessel CAD involving the first diagonal and RCA subbranches.  Normal LV systolic function on TTE in 2015.  Having shortness of breath with minimal exertion  TTE on 06/29/2019 showed normal biventricular function, mild mitral stenosis, mild aortic regurgitation.  Lexiscan  Myoview  on 06/22/2019 showed normal perfusion.   -Continue aspirin  81 mg daily. -Continue atorvastatin 80 mg daily.  LDL 38 on 09/24/2021 -Reporting worsening dyspnea on exertion, echo with severe mitral stenosis, plan RHC/LHC as above  Aortic regurgitation: Mild on TTE 06/29/19.  Trivial on echo 03/16/2023  Hyperlipidemia: On atorvastatin 80 mg daily.  LDL 39 on 10/08/2022  Hypertension: On telmisartan 80 mg daily and amlodipine  10 mg daily.  Will decrease amlodipine  to 5 mg  daily with adding Toprol -XL 25 mg daily as above  Type 1 diabetes: On insulin .  A1c 8.0% on 02/18/2023  Leg pain: Arterial duplex 04/2022 showed heavily calcified vessels without evidence of significant stenosis  RTC in 1 month  Medication Adjustments/Labs and Tests Ordered: Current medicines are reviewed at length with the patient today.  Concerns regarding medicines are outlined above.  Orders Placed This Encounter  Procedures   Basic Metabolic Panel (BMET)  CBC w/Diff/Platelet   EKG 12-Lead   LEFT AND RIGHT HEART CATHETERIZATION WITH CORONARY ANGIOGRAM    Meds ordered this encounter  Medications   metoprolol  succinate (TOPROL  XL) 25 MG 24 hr tablet    Sig: Take 1 tablet (25 mg total) by mouth daily.    Dispense:  90 tablet    Refill:  3   amLODipine  (NORVASC ) 5 MG tablet    Sig: Take 1 tablet (5 mg total) by mouth daily.    Dispense:  90 tablet    Refill:  3     Patient Instructions  Medication Instructions:  Start Toprol  XL 25 mg daily Start Amlodipine  5mg   daily  Stop Amlodipine  10mg  daily  Continue all current medications *If you need a refill on your cardiac medications before your next appointment, please call your pharmacy*   Lab Work: BMET, CBC TODAY If you have labs (blood work) drawn today and your tests are completely normal, you will receive your results only by: MyChart Message (if you have MyChart) OR A paper copy in the mail If you have any lab test that is abnormal or we need to change your treatment, we will call you to review the results.   Testing/Procedures:   MEKENNA FINAU  03/21/2023  You are scheduled for a Cardiac Catheterization on Wednesday, January 15 with Dr. Ozell Fell.  1. Please arrive at the [facility] (Main Entrance A) at [facility]: [address] [place], [place] [address] at 12:30 PM (This time is 2 hour(s) before your procedure to ensure your preparation).   Free valet parking service is available. You  will check in at ADMITTING. The support person will be asked to wait in the waiting room.  It is OK to have someone drop you off and come back when you are ready to be discharged.    Special note: Every effort is made to have your procedure done on time. Please understand that emergencies sometimes delay scheduled procedures.  2. Diet: Do not eat solid foods after midnight.  The patient may have clear liquids until 5am upon the day of the procedure.  3. Labs: Bmet , cbc today 4. Medication instructions in preparation for your procedure:   Contrast Allergy: No  The night before your procedure take half of  Novolog  and half of lantus dose  The day of your procedure do not take Telmisartan 80 mg    On the morning of your procedure, take your Aspirin  81 mg and any morning medicines NOT listed above.  You may use sips of water.  5. Plan to go home the same day, you will only stay overnight if medically necessary. 6. Bring a current list of your medications and current insurance cards. 7. You MUST have a responsible person to drive you home. 8. Someone MUST be with you the first 24 hours after you arrive home or your discharge will be delayed. 9. Please wear clothes that are easy to get on and off and wear slip-on shoes.  Thank you for allowing us  to care for you!   -- [facility]    Follow-Up: At [facility], you and your health needs are our priority.  As part of our continuing mission to provide you with exceptional heart care, we have created designated Provider Care Teams.  These Care Teams include your primary Cardiologist (physician) and Advanced Practice Providers (APPs -  Physician Assistants and Nurse Practitioners) who all work together to  provide you with the care you need, when you need it.  We recommend signing up for the patient portal called MyChart.  Sign up information is provided on this After Visit Summary.  MyChart is used to  connect with patients for Virtual Visits (Telemedicine).  Patients are able to view lab/test results, encounter notes, upcoming appointments, etc.  Non-urgent messages can be sent to your provider as well.   To learn more about what you can do with MyChart, go to [email or web address].    Your next appointment:   1 month(s) please schedule on Feb 4 at [facility] office  Provider:   [name], MD     Other Instructions none         Signed, [name], MD  03/21/2023 1:22 PM    [facility]

## 2023-03-21 ENCOUNTER — Encounter: Payer: Self-pay | Admitting: Cardiology

## 2023-03-21 ENCOUNTER — Ambulatory Visit: Payer: Medicare PPO | Attending: Cardiology | Admitting: Cardiology

## 2023-03-21 VITALS — BP 138/40 | HR 86 | Ht 63.0 in | Wt 124.0 lb

## 2023-03-21 DIAGNOSIS — E785 Hyperlipidemia, unspecified: Secondary | ICD-10-CM | POA: Diagnosis not present

## 2023-03-21 DIAGNOSIS — I050 Rheumatic mitral stenosis: Secondary | ICD-10-CM

## 2023-03-21 DIAGNOSIS — I2510 Atherosclerotic heart disease of native coronary artery without angina pectoris: Secondary | ICD-10-CM

## 2023-03-21 DIAGNOSIS — I10 Essential (primary) hypertension: Secondary | ICD-10-CM

## 2023-03-21 DIAGNOSIS — R0609 Other forms of dyspnea: Secondary | ICD-10-CM

## 2023-03-21 LAB — CBC WITH DIFFERENTIAL/PLATELET

## 2023-03-21 MED ORDER — AMLODIPINE BESYLATE 5 MG PO TABS: 5.0000 mg | ORAL_TABLET | Freq: Every day | ORAL | 3 refills | Status: DC

## 2023-03-21 MED ORDER — METOPROLOL SUCCINATE ER 25 MG PO TB24: 25.0000 mg | ORAL_TABLET | Freq: Every day | ORAL | 3 refills | Status: DC

## 2023-03-21 NOTE — Patient Instructions (Addendum)
 Medication Instructions:  Start Toprol  XL 25 mg daily Start Amlodipine  5mg   daily  Stop Amlodipine  10mg  daily  Continue all current medications *If you need a refill on your cardiac medications before your next appointment, please call your pharmacy*   Lab Work: BMET, CBC TODAY If you have labs (blood work) drawn today and your tests are completely normal, you will receive your results only by: MyChart Message (if you have MyChart) OR A paper copy in the mail If you have any lab test that is abnormal or we need to change your treatment, we will call you to review the results.   Testing/Procedures:   Abigail Green  03/21/2023  You are scheduled for a Cardiac Catheterization on Wednesday, January 15 with Dr. Ozell Fell.  1. Please arrive at the [facility] (Main Entrance A) at  [facility]: [address] [place], [place] [address] at 12:30 PM (This time is 2 hour(s) before your procedure to ensure your preparation).   Free valet parking service is available. You will check in at ADMITTING. The support person will be asked to wait in the waiting room.  It is OK to have someone drop you off and come back when you are ready to be discharged.    Special note: Every effort is made to have your procedure done on time. Please understand that emergencies sometimes delay scheduled procedures.  2. Diet: Do not eat solid foods after midnight.  The patient may have clear liquids until 5am upon the day of the procedure.  3. Labs: Bmet , cbc today 4. Medication instructions in preparation for your procedure:   Contrast Allergy: No  The night before your procedure take half of  Novolog  and half of lantus dose  The day of your procedure do not take Telmisartan 80 mg    On the morning of your procedure, take your Aspirin  81 mg and any morning medicines NOT listed above.  You may use sips of water.  5. Plan to go home the same day, you will only stay overnight if medically  necessary. 6. Bring a current list of your medications and current insurance cards. 7. You MUST have a responsible person to drive you home. 8. Someone MUST be with you the first 24 hours after you arrive home or your discharge will be delayed. 9. Please wear clothes that are easy to get on and off and wear slip-on shoes.  Thank you for allowing us  to care for you!   -- [facility]    Follow-Up: At [facility], you and your health needs are our priority.  As part of our continuing mission to provide you with exceptional heart care, we have created designated Provider Care Teams.  These Care Teams include your primary Cardiologist (physician) and Advanced Practice Providers (APPs -  Physician Assistants and Nurse Practitioners) who all work together to provide you with the care you need, when you need it.  We recommend signing up for the patient portal called MyChart.  Sign up information is provided on this After Visit Summary.  MyChart is used to connect with patients for Virtual Visits (Telemedicine).  Patients are able to view lab/test results, encounter notes, upcoming appointments, etc.  Non-urgent messages can be sent to your provider as well.   To learn more about what you can do with MyChart, go to [email or web address].    Your next appointment:   1 month(s) please schedule on Feb 4 at [facility] office  Provider:   [name], MD     Other Instructions none

## 2023-03-22 ENCOUNTER — Telehealth: Payer: Self-pay | Admitting: *Deleted

## 2023-03-22 LAB — CBC WITH DIFFERENTIAL/PLATELET
Basos: 1 %
EOS (ABSOLUTE): 0 10*3/uL (ref 0.0–0.2)
Eos: 2 %
Hematocrit: 36.3 % (ref 34.0–46.6)
Hemoglobin: 11.8 g/dL (ref 11.1–15.9)
Immature Granulocytes: 0 %
Immature Granulocytes: 0 10*3/uL (ref 0.0–0.1)
Lymphs: 26 %
MCH: 30.6 pg (ref 26.6–33.0)
MCHC: 32.5 g/dL (ref 31.5–35.7)
MCV: 94 fL (ref 79–97)
Monocytes Absolute: 0.1 10*3/uL (ref 0.0–0.4)
Monocytes Absolute: 0.8 10*3/uL (ref 0.1–0.9)
Monocytes: 13 %
Neutrophils Absolute: 1.7 10*3/uL (ref 0.7–3.1)
Neutrophils Absolute: 3.7 10*3/uL (ref 1.4–7.0)
Neutrophils: 58 %
Platelets: 251 10*3/uL (ref 150–450)
RBC: 3.86 x10E6/uL (ref 3.77–5.28)
RDW: 13.1 % (ref 11.7–15.4)
WBC: 6.4 10*3/uL (ref 3.4–10.8)

## 2023-03-22 LAB — BASIC METABOLIC PANEL
BUN/Creatinine Ratio: 21 (ref 12–28)
BUN: 23 mg/dL (ref 8–27)
CO2: 23 mmol/L (ref 20–29)
Calcium: 9.6 mg/dL (ref 8.7–10.3)
Chloride: 97 mmol/L (ref 96–106)
Creatinine, Ser: 1.11 mg/dL — ABNORMAL HIGH (ref 0.57–1.00)
Glucose: 301 mg/dL — ABNORMAL HIGH (ref 70–99)
Potassium: 5 mmol/L (ref 3.5–5.2)
Sodium: 136 mmol/L (ref 134–144)
eGFR: 51 mL/min/{1.73_m2} — ABNORMAL LOW (ref >59)

## 2023-03-22 NOTE — Telephone Encounter (Signed)
 Cardiac Catheterization scheduled at [facility] for: Wednesday March 23, 2023 2:30 PM Arrival time [facility] Main Entrance A at: 12:30 PM  Nothing to eat after midnight prior to procedure, clear liquids until 5 AM day of procedure.  Medication instructions: -Hold:  Insulin -AM of procedure-pt reports she does not use HS Insulin   Micardis-day before and day of procedure-per protocol GFR <60 (51)-pt has already taken today  Aleve until post procedure-per protocol GFR<60 -Other usual morning medications can be taken with sips of water including aspirin  81 mg.  Plan to go home the same day, you will only stay overnight if medically necessary.  You must have responsible adult to drive you home.  Someone must be with you the first 24 hours after you arrive home.  Reviewed procedure instructions with patient.

## 2023-03-23 ENCOUNTER — Other Ambulatory Visit: Payer: Self-pay

## 2023-03-23 ENCOUNTER — Encounter (HOSPITAL_COMMUNITY)
Admission: RE | Disposition: A | Discharge status: Home / Self Care | Payer: Self-pay | Source: Home / Self Care | Attending: Cardiovascular Disease

## 2023-03-23 ENCOUNTER — Ambulatory Visit (HOSPITAL_COMMUNITY)
Admission: RE | Admit: 2023-03-23 | Discharge: 2023-03-23 | Disposition: A | Discharge status: Home / Self Care | Payer: Medicare PPO | Attending: Cardiovascular Disease | Admitting: Cardiovascular Disease

## 2023-03-23 DIAGNOSIS — E785 Hyperlipidemia, unspecified: Secondary | ICD-10-CM | POA: Diagnosis not present

## 2023-03-23 DIAGNOSIS — I2584 Coronary atherosclerosis due to calcified coronary lesion: Secondary | ICD-10-CM | POA: Diagnosis not present

## 2023-03-23 DIAGNOSIS — I2582 Chronic total occlusion of coronary artery: Secondary | ICD-10-CM | POA: Diagnosis not present

## 2023-03-23 DIAGNOSIS — I2510 Atherosclerotic heart disease of native coronary artery without angina pectoris: Secondary | ICD-10-CM | POA: Insufficient documentation

## 2023-03-23 DIAGNOSIS — I342 Nonrheumatic mitral (valve) stenosis: Secondary | ICD-10-CM | POA: Diagnosis not present

## 2023-03-23 DIAGNOSIS — R0609 Other forms of dyspnea: Secondary | ICD-10-CM | POA: Insufficient documentation

## 2023-03-23 DIAGNOSIS — Z7982 Long term (current) use of aspirin: Secondary | ICD-10-CM | POA: Diagnosis not present

## 2023-03-23 DIAGNOSIS — Z79899 Other long term (current) drug therapy: Secondary | ICD-10-CM | POA: Insufficient documentation

## 2023-03-23 DIAGNOSIS — E109 Type 1 diabetes mellitus without complications: Secondary | ICD-10-CM | POA: Insufficient documentation

## 2023-03-23 DIAGNOSIS — Z794 Long term (current) use of insulin: Secondary | ICD-10-CM | POA: Diagnosis not present

## 2023-03-23 DIAGNOSIS — I10 Essential (primary) hypertension: Secondary | ICD-10-CM | POA: Insufficient documentation

## 2023-03-23 HISTORY — PX: RIGHT/LEFT HEART CATH AND CORONARY ANGIOGRAPHY: CATH118266

## 2023-03-23 LAB — GLUCOSE, CAPILLARY
Glucose-Capillary: 397 mg/dL — ABNORMAL HIGH (ref 70–99)
Glucose-Capillary: 430 mg/dL — ABNORMAL HIGH (ref 70–99)
Glucose-Capillary: 408 mg/dL — ABNORMAL HIGH (ref 70–99)
Glucose-Capillary: 218 mg/dL — ABNORMAL HIGH (ref 70–99)

## 2023-03-23 LAB — POCT I-STAT 7, (LYTES, BLD GAS, ICA,H+H)
Acid-base deficit: 1 mmol/L (ref 0.0–2.0)
Bicarbonate: 23.5 mmol/L (ref 20.0–28.0)
Calcium, Ion: 1.21 mmol/L (ref 1.15–1.40)
HCT: 29 % — ABNORMAL LOW (ref 36.0–46.0)
Hemoglobin: 9.9 g/dL — ABNORMAL LOW (ref 12.0–15.0)
O2 Saturation: 92 %
Potassium: 4.1 mmol/L (ref 3.5–5.1)
Sodium: 136 mmol/L (ref 135–145)
TCO2: 25 mmol/L (ref 22–32)
pCO2 arterial: 37.5 mm[Hg] (ref 32–48)
pH, Arterial: 7.405 (ref 7.35–7.45)
pO2, Arterial: 63 mm[Hg] — ABNORMAL LOW (ref 83–108)

## 2023-03-23 SURGERY — RIGHT/LEFT HEART CATH AND CORONARY ANGIOGRAPHY
Anesthesia: LOCAL

## 2023-03-23 MED ORDER — MIDAZOLAM HCL 2 MG/2ML IJ SOLN
INTRAMUSCULAR | Status: DC | PRN
  Administered 2023-03-23 (×2): 1 mg via INTRAVENOUS

## 2023-03-23 MED ORDER — ASPIRIN 81 MG PO CHEW
81.0000 mg | CHEWABLE_TABLET | ORAL | Status: AC
  Administered 2023-03-23: 81 mg via ORAL

## 2023-03-23 MED ORDER — LIDOCAINE HCL (PF) 1 % IJ SOLN
INTRAMUSCULAR | Status: AC
  Filled 2023-03-23: qty 30

## 2023-03-23 MED ORDER — ACETAMINOPHEN 325 MG PO TABS: 650.0000 mg | ORAL_TABLET | ORAL | Status: DC | PRN

## 2023-03-23 MED ORDER — SODIUM CHLORIDE 0.9 % IV SOLN: 250.0000 mL | INTRAVENOUS | Status: DC | PRN

## 2023-03-23 MED ORDER — SODIUM CHLORIDE 0.9% FLUSH: 3.0000 mL | INTRAVENOUS | Status: DC | PRN

## 2023-03-23 MED ORDER — FENTANYL CITRATE (PF) 100 MCG/2ML IJ SOLN
INTRAMUSCULAR | Status: DC | PRN
  Administered 2023-03-23 (×2): 25 ug via INTRAVENOUS

## 2023-03-23 MED ORDER — INSULIN ASPART 100 UNIT/ML IJ SOLN
12.0000 [IU] | Freq: Once | INTRAMUSCULAR | Status: AC
  Administered 2023-03-23: 12 [IU] via SUBCUTANEOUS
  Filled 2023-03-23: qty 1

## 2023-03-23 MED ORDER — IOHEXOL 350 MG/ML SOLN
INTRAVENOUS | Status: DC | PRN
  Administered 2023-03-23: 40 mL

## 2023-03-23 MED ORDER — HYDRALAZINE HCL 20 MG/ML IJ SOLN: 10.0000 mg | INTRAMUSCULAR | Status: DC | PRN

## 2023-03-23 MED ORDER — VERAPAMIL HCL 2.5 MG/ML IV SOLN
INTRAVENOUS | Status: AC
  Filled 2023-03-23: qty 2

## 2023-03-23 MED ORDER — SODIUM CHLORIDE 0.9 % WEIGHT BASED INFUSION: 3.0000 mL/kg/h | INTRAVENOUS | Status: AC

## 2023-03-23 MED ORDER — SODIUM CHLORIDE 0.9 % WEIGHT BASED INFUSION: 1.0000 mL/kg/h | INTRAVENOUS | Status: DC

## 2023-03-23 MED ORDER — ONDANSETRON HCL 4 MG/2ML IJ SOLN: 4.0000 mg | Freq: Four times a day (QID) | INTRAMUSCULAR | Status: DC | PRN

## 2023-03-23 MED ORDER — HEPARIN (PORCINE) IN NACL 1000-0.9 UT/500ML-% IV SOLN
INTRAVENOUS | Status: DC | PRN
  Administered 2023-03-23 (×2): 500 mL

## 2023-03-23 MED ORDER — SODIUM CHLORIDE 0.9% FLUSH: 3.0000 mL | Freq: Two times a day (BID) | INTRAVENOUS | Status: DC

## 2023-03-23 MED ORDER — MIDAZOLAM HCL 2 MG/2ML IJ SOLN
INTRAMUSCULAR | Status: AC
Start: 2023-03-23 — End: ?
  Filled 2023-03-23: qty 2

## 2023-03-23 MED ORDER — LIDOCAINE HCL (PF) 1 % IJ SOLN
INTRAMUSCULAR | Status: DC | PRN
  Administered 2023-03-23: 10 mL

## 2023-03-23 MED ORDER — HEPARIN SODIUM (PORCINE) 1000 UNIT/ML IJ SOLN
INTRAMUSCULAR | Status: AC
  Filled 2023-03-23: qty 10

## 2023-03-23 MED ORDER — FENTANYL CITRATE (PF) 100 MCG/2ML IJ SOLN
INTRAMUSCULAR | Status: AC
  Filled 2023-03-23: qty 2

## 2023-03-23 MED ORDER — LABETALOL HCL 5 MG/ML IV SOLN: 10.0000 mg | INTRAVENOUS | Status: DC | PRN

## 2023-03-23 SURGICAL SUPPLY — 15 items
CATH BALLN WEDGE 5F 110CM (CATHETERS) IMPLANT
CATH INFINITI 5FR MULTPACK ANG (CATHETERS) IMPLANT
CLOSURE MYNX CONTROL 5F (Vascular Products) IMPLANT
GLIDESHEATH SLEND SS 6F .021 (SHEATH) IMPLANT
GUIDEWIRE .025 260CM (WIRE) IMPLANT
GUIDEWIRE INQWIRE 1.5J.035X260 (WIRE) IMPLANT
INQWIRE 1.5J .035X260CM (WIRE) ×1
KIT MICROPUNCTURE NIT STIFF (SHEATH) IMPLANT
KIT SINGLE USE MANIFOLD (KITS) IMPLANT
PACK CARDIAC CATHETERIZATION (CUSTOM PROCEDURE TRAY) ×1 IMPLANT
SET ATX-X65L (MISCELLANEOUS) IMPLANT
SHEATH GLIDE SLENDER 4/5FR (SHEATH) IMPLANT
SHEATH PINNACLE 5F 10CM (SHEATH) IMPLANT
WIRE EMERALD 3MM-J .035X150CM (WIRE) IMPLANT
WIRE EMERALD ST .035X150CM (WIRE) IMPLANT

## 2023-03-23 NOTE — Interval H&P Note (Signed)
 History and Physical Interval Note:  03/23/2023 2:42 PM  Abigail Green  has presented today for surgery, with the diagnosis of mitral stenosis - dsynea.  The various methods of treatment have been discussed with the patient and family. After consideration of risks, benefits and other options for treatment, the patient has consented to  Procedure(s): RIGHT/LEFT HEART CATH AND CORONARY ANGIOGRAPHY (N/A) as a surgical intervention.  The patient's history has been reviewed, patient examined, no change in status, stable for surgery.  I have reviewed the patient's chart and labs.  Questions were answered to the patient's satisfaction.     Arnoldo Lapping

## 2023-03-23 NOTE — Discharge Instructions (Signed)

## 2023-03-24 ENCOUNTER — Encounter (HOSPITAL_COMMUNITY): Payer: Self-pay | Admitting: Cardiovascular Disease

## 2023-03-24 LAB — POCT I-STAT EG7
Acid-Base Excess: 0 mmol/L (ref 0.0–2.0)
Bicarbonate: 25.2 mmol/L (ref 20.0–28.0)
Calcium, Ion: 1.21 mmol/L (ref 1.15–1.40)
HCT: 30 % — ABNORMAL LOW (ref 36.0–46.0)
Hemoglobin: 10.2 g/dL — ABNORMAL LOW (ref 12.0–15.0)
O2 Saturation: 67 %
Potassium: 4.1 mmol/L (ref 3.5–5.1)
Sodium: 136 mmol/L (ref 135–145)
TCO2: 26 mmol/L (ref 22–32)
pCO2, Ven: 41.7 mm[Hg] — ABNORMAL LOW (ref 44–60)
pH, Ven: 7.389 (ref 7.25–7.43)
pO2, Ven: 36 mm[Hg] (ref 32–45)

## 2023-03-25 ENCOUNTER — Encounter: Payer: Self-pay | Admitting: *Deleted

## 2023-04-01 ENCOUNTER — Other Ambulatory Visit: Payer: Self-pay | Admitting: Cardiology

## 2023-04-02 DIAGNOSIS — E1022 Type 1 diabetes mellitus with diabetic chronic kidney disease: Secondary | ICD-10-CM | POA: Diagnosis not present

## 2023-04-12 ENCOUNTER — Ambulatory Visit (HOSPITAL_BASED_OUTPATIENT_CLINIC_OR_DEPARTMENT_OTHER): Payer: Medicare PPO | Admitting: Cardiology

## 2023-04-12 ENCOUNTER — Encounter (HOSPITAL_BASED_OUTPATIENT_CLINIC_OR_DEPARTMENT_OTHER): Payer: Self-pay | Admitting: Cardiology

## 2023-04-12 VITALS — BP 130/58 | HR 70 | Ht 63.0 in | Wt 123.7 lb

## 2023-04-12 DIAGNOSIS — E785 Hyperlipidemia, unspecified: Secondary | ICD-10-CM | POA: Diagnosis not present

## 2023-04-12 DIAGNOSIS — I2510 Atherosclerotic heart disease of native coronary artery without angina pectoris: Secondary | ICD-10-CM

## 2023-04-12 DIAGNOSIS — I10 Essential (primary) hypertension: Secondary | ICD-10-CM

## 2023-04-12 DIAGNOSIS — I050 Rheumatic mitral stenosis: Secondary | ICD-10-CM

## 2023-04-12 NOTE — Progress Notes (Signed)
 Cardiology Office Note:    Date:  04/12/2023   ID:  Abigail Green, DOB 02-15-1946, MRN [id number]  PCP:  Nichole Senior, MD  Cardiologist:  [name], MD  Electrophysiologist:  None   Referring MD: Nichole Senior, MD   Chief Complaint  Patient presents with   Cardiac Valve Problem    History of Present Illness:    Abigail Green is a 78 y.o. female with a hx of type 1 diabetes, hypertension, hyperlipidemia who presents for follow-up.  She was referred by Dr. Nichole for evaluation of coronary artery disease, initially seen on 06/20/2019.  She reported rare chest pain.   TTE on 10/31/2013 showed LVEF 65 to 70%, grade 1 diastolic dysfunction, mild AI, severe mitral annular calcification, normal RV function.  Cath on 12/30/2015 showed moderate small vessel CAD involving the first diagonal and RCA subbranches.  TTE on 06/29/2019 showed normal biventricular function, mild mitral stenosis, mild aortic regurgitation.  Lexiscan  Myoview  on 06/22/2019 showed normal perfusion.  Echocardiogram 03/16/2023 showed EF 60 to 65%, grade 2 diastolic dysfunction, normal RV function, severe mitral stenosis (mean gradient 12 mmHg, MVA 1.2 cm by continuity equation).  LHC/RHC on 03/23/2023 showed occluded diagonal, severe proximal circumflex stenosis (small vessel), moderate mitral stenosis with mean gradient 7 mmHg, normal right heart pressures.  Since last clinic visit, she reports she is doing well.  States that she feels that her dyspnea on exertion has improved.  Denies any chest pain, lightheadedness, syncope, or palpitations.  Does report some swelling in legs at the end of day.  She walks inside her house for 15 minutes when its cold and will walk outside for about 20 minutes when weather is nice.   Past Medical History:  Diagnosis Date   Cataract    removed bilaterally   Diabetes mellitus type 1 (HCC) 12/29/2015   Essential hypertension 12/29/2015   Family history of premature coronary  artery disease 12/29/2015   Hyperlipidemia 12/29/2015   Hypoparathyroidism (HCC)     Past Surgical History:  Procedure Laterality Date   BREAST CYST EXCISION Left    CARDIAC CATHETERIZATION N/A 12/30/2015   Procedure: Left Heart Cath and Coronary Angiography;  Surgeon: Ozell Fell, MD;  Location: [facility] INVASIVE CV LAB;  Service: Cardiovascular;  Laterality: N/A;   CATARACT EXTRACTION, BILATERAL     CESAREAN SECTION     x 2   RIGHT/LEFT HEART CATH AND CORONARY ANGIOGRAPHY N/A 03/23/2023   Procedure: RIGHT/LEFT HEART CATH AND CORONARY ANGIOGRAPHY;  Surgeon: Fell Ozell, MD;  Location: [facility] INVASIVE CV LAB;  Service: Cardiovascular;  Laterality: N/A;   THYROIDECTOMY     TRIGGER FINGER RELEASE Right    Thumb, A1 pully and incidental synovectomy of superficialis profundus tendons   TRIGGER FINGER RELEASE Left     Current Medications: Current Meds  Medication Sig   amLODipine  (NORVASC ) 5 MG tablet Take 1 tablet (5 mg total) by mouth daily.   aspirin  EC 81 MG tablet Take 81 mg by mouth daily.   atorvastatin (LIPITOR) 80 MG tablet Take 80 mg by mouth every evening.   Calcium Carbonate (CALCIUM 600 PO) Take 2 tablets by mouth in the morning and at bedtime.   Cholecalciferol (VITAMIN D3) 250 MCG (10000 UT) capsule Take 10,000 Units by mouth daily.   Continuous Blood Gluc Sensor (FREESTYLE LIBRE 2 SENSOR) MISC as directed topically change every 14 days to monitor blood glucose continuously   insulin  aspart (NOVOLOG  FLEXPEN) 100 UNIT/ML FlexPen Inject 10-12 Units into the skin  3 (three) times daily with meals.   Insulin  Glargine (LANTUS [place]) Inject 33 Units into the skin in the morning.   metoprolol  succinate (TOPROL  XL) 25 MG 24 hr tablet Take 1 tablet (25 mg total) by mouth daily.   Multiple Vitamin (MULTIVITAMIN) capsule Take 1 capsule by mouth daily.   naproxen sodium (ALEVE) 220 MG tablet Take 220 mg by mouth daily as needed (Headache).   Omega-3 Fatty Acids (FISH OIL) 1200 MG CAPS Take  1,200 mg by mouth daily.   SYNTHROID 112 MCG tablet Take 56-112 mcg by mouth See admin instructions. Take 112 mcg every morning before breakfast and 56 mcg tablet on Wednesday and Sunday   telmisartan (MICARDIS) 80 MG tablet Take 80 mg by mouth daily.   zinc gluconate 50 MG tablet Take 50 mg by mouth daily.     Allergies:   Penicillins   Social History   Socioeconomic History   Marital status: Married    Spouse name: Not on file   Number of children: 2   Years of education: Not on file   Highest education level: Not on file  Occupational History   Occupation: retired   Tobacco Use   Smoking status: Never   Smokeless tobacco: Never  Vaping Use   Vaping status: Never Used  Substance and Sexual Activity   Alcohol  use: No   Drug use: No   Sexual activity: Not on file  Other Topics Concern   Not on file  Social History Narrative   Not on file   Social Drivers of Health   Financial Resource Strain: Not on file  Food Insecurity: Not on file  Transportation Needs: Not on file  Physical Activity: Not on file  Stress: Not on file  Social Connections: Not on file     Family History: The patient's family history includes Addison's disease in her sister; CAD in her mother; COPD in her sister; Diabetes in her brother; Heart failure in her mother. There is no history of Colon cancer, Esophageal cancer, Rectal cancer, or Stomach cancer.  ROS:   Please see the history of present illness.     All other systems reviewed and are negative.  EKGs/Labs/Other Studies Reviewed:    The following studies were reviewed today:   EKG:   01/20/2021: Normal sinus rhythm, biatrial enlargement, rate 80, no ST abnormality 03/30/2022: Normal sinus rhythm, rate 80, left atrial enlargement, no ST abnormality 03/21/2023: Normal sinus rhythm, rate 86, biatrial enlargement  TTE 10/31/13: - Left ventricle: The cavity size was normal. Systolic function was    vigorous. The estimated ejection fraction  was in the range of 65%    to 70%. Wall motion was normal; there were no regional wall    motion abnormalities. Doppler parameters are consistent with    abnormal left ventricular relaxation (grade 1 diastolic    dysfunction). Doppler parameters are consistent with elevated    ventricular end-diastolic filling pressure.  - Aortic valve: Trileaflet; mildly thickened leaflets.    Transvalvular velocity was within the normal range. There was no    stenosis. There was mild regurgitation.  - Aortic root: The aortic root was normal in size.  - Mitral valve: Severe mitral annular calcification, predominantly    posterior.  - Left atrium: The atrium was mildly dilated.  - Right ventricle: Systolic function was normal.  - Right atrium: The atrium was normal in size.  - Pulmonary arteries: Systolic pressure was within the normal    range.  -  Pericardium, extracardiac: There was no pericardial effusion.   Impressions:   - Normal biventricular size and systolic function.    Abnormal relaxation with elevated filling pressures.    Mild aortic regurgitation.   Cath 12/30/15: 1. Moderate small vessel coronary artery disease involving the first diagonal and RCA subbranches. 2. Widely patent RCA, LAD, left main, and left circumflex vessels 3. Normal LVEDP 4. Heavily calcified mitral annulus   Recommendations: Medical therapy   Recent Labs: 03/21/2023: BUN 23; Creatinine, Ser 1.11; Platelets 251 03/23/2023: Hemoglobin 9.9; Potassium 4.1; Sodium 136  Recent Lipid Panel    Component Value Date/Time   CHOL 128 01/20/2021 1416   TRIG 62 01/20/2021 1416   HDL 83 01/20/2021 1416   CHOLHDL 1.5 01/20/2021 1416   LDLCALC 32 01/20/2021 1416    Physical Exam:    VS:  BP (!) 130/58 (BP Location: Left Arm, Patient Position: Sitting)   Pulse 70   Ht 5' 3 (1.6 m)   Wt 123 lb 11.2 oz (56.1 kg)   SpO2 98%   BMI 21.91 kg/m     Wt Readings from Last 3 Encounters:  04/12/23 123 lb 11.2 oz (56.1 kg)   03/23/23 125 lb (56.7 kg)  03/21/23 124 lb (56.2 kg)     GEN:   in no acute distress HEENT: Normal NECK: No JVD CARDIAC: RRR, no murmurs, rubs, gallops RESPIRATORY:  Clear to auscultation without rales, wheezing or rhonchi  ABDOMEN: Soft, non-tender, non-distended MUSCULOSKELETAL:  No edema; No deformity  SKIN: Warm and dry NEUROLOGIC:  Alert and oriented x 3 PSYCHIATRIC:  Normal affect   ASSESSMENT:    1. Mitral valve stenosis, unspecified etiology   2. Coronary artery disease involving native coronary artery of native heart, unspecified whether angina present   3. Essential hypertension   4. Hyperlipidemia, unspecified hyperlipidemia type      PLAN:    Mitral stenosis: Echocardiogram 03/16/2023 showed EF 60 to 65%, grade 2 diastolic dysfunction, normal RV function, severe mitral stenosis (mean gradient 12 mmHg, MVA 1.2 cm by continuity equation).  LHC/RHC on 03/23/2023 showed occluded diagonal, severe proximal circumflex stenosis (small vessel), moderate mitral stenosis with mean gradient 7 mmHg, normal right heart pressures. -Continue Toprol -XL 25 mg daily to decrease heart rate to improve gradients.  RHC/LHC after starting metoprolol  showed improvement in gradients, suggested moderate mitral stenosis.  She also reports improvement in her dyspnea on exertion since starting metoprolol .  Continue medical management at this time  CAD: Cath on 12/30/2015 showed moderate small vessel CAD involving the first diagonal and RCA subbranches.  Normal LV systolic function on TTE in 2015.  Having shortness of breath with minimal exertion  TTE on 06/29/2019 showed normal biventricular function, mild mitral stenosis, mild aortic regurgitation.  Lexiscan  Myoview  on 06/22/2019 showed normal perfusion.  LHC/RHC on 03/23/2023 showed occluded diagonal, severe proximal circumflex stenosis (small vessel) -Continue aspirin  81 mg daily. -Continue atorvastatin 80 mg daily.    Aortic regurgitation: Mild on  TTE 06/29/19.  Trivial on echo 03/16/2023  Hyperlipidemia: On atorvastatin 80 mg daily.  LDL 39 on 10/08/2022  Hypertension: On telmisartan 80 mg daily and amlodipine  5 mg daily and Toprol -XL 25 mg daily  Type 1 diabetes: On insulin .  A1c 8.0% on 02/18/2023  Leg pain: Arterial duplex 04/2022 showed heavily calcified vessels without evidence of significant stenosis  RTC in 6 months  Medication Adjustments/Labs and Tests Ordered: Current medicines are reviewed at length with the patient today.  Concerns regarding medicines are outlined above.  No orders of the defined types were placed in this encounter.   No orders of the defined types were placed in this encounter.    Patient Instructions  Medication Instructions:  Your physician recommends that you continue on your current medications as directed. Please refer to the Current Medication list given to you today.  *If you need a refill on your cardiac medications before your next appointment, please call your pharmacy*  Lab Work: NONE  Testing/Procedures: NONE  Follow-Up: At [facility], you and your health needs are our priority.  As part of our continuing mission to provide you with exceptional heart care, we have created designated Provider Care Teams.  These Care Teams include your primary Cardiologist (physician) and Advanced Practice Providers (APPs -  Physician Assistants and Nurse Practitioners) who all work together to provide you with the care you need, when you need it.  We recommend signing up for the patient portal called MyChart.  Sign up information is provided on this After Visit Summary.  MyChart is used to connect with patients for Virtual Visits (Telemedicine).  Patients are able to view lab/test results, encounter notes, upcoming appointments, etc.  Non-urgent messages can be sent to your provider as well.   To learn more about what you can do with MyChart, go to [email or web address].    Your next  appointment:   6 month(s)  The format for your next appointment:   In Person  Provider:   DR [facility]    Signed, [name], MD  04/12/2023 10:44 AM    [facility]

## 2023-04-12 NOTE — Patient Instructions (Signed)
Medication Instructions:  Your physician recommends that you continue on your current medications as directed. Please refer to the Current Medication list given to you today.  *If you need a refill on your cardiac medications before your next appointment, please call your pharmacy*  Lab Work: NONE  Testing/Procedures: NONE  Follow-Up: At [facility], you and your health needs are our priority.  As part of our continuing mission to provide you with exceptional heart care, we have created designated Provider Care Teams.  These Care Teams include your primary Cardiologist (physician) and Advanced Practice Providers (APPs -  Physician Assistants and Nurse Practitioners) who all work together to provide you with the care you need, when you need it.  We recommend signing up for the patient portal called "MyChart".  Sign up information is provided on this After Visit Summary.  MyChart is used to connect with patients for Virtual Visits (Telemedicine).  Patients are able to view lab/test results, encounter notes, upcoming appointments, etc.  Non-urgent messages can be sent to your provider as well.   To learn more about what you can do with MyChart, go to [email or web address].    Your next appointment:   6 month(s)  The format for your next appointment:   In Person  Provider:   DR [facility]

## 2023-04-14 DIAGNOSIS — Z794 Long term (current) use of insulin: Secondary | ICD-10-CM | POA: Diagnosis not present

## 2023-04-14 DIAGNOSIS — I2510 Atherosclerotic heart disease of native coronary artery without angina pectoris: Secondary | ICD-10-CM | POA: Diagnosis not present

## 2023-04-14 DIAGNOSIS — E1022 Type 1 diabetes mellitus with diabetic chronic kidney disease: Secondary | ICD-10-CM | POA: Diagnosis not present

## 2023-04-14 DIAGNOSIS — I129 Hypertensive chronic kidney disease with stage 1 through stage 4 chronic kidney disease, or unspecified chronic kidney disease: Secondary | ICD-10-CM | POA: Diagnosis not present

## 2023-04-14 DIAGNOSIS — N1831 Chronic kidney disease, stage 3a: Secondary | ICD-10-CM | POA: Diagnosis not present

## 2023-05-03 DIAGNOSIS — E1022 Type 1 diabetes mellitus with diabetic chronic kidney disease: Secondary | ICD-10-CM | POA: Diagnosis not present

## 2023-05-31 DIAGNOSIS — E1022 Type 1 diabetes mellitus with diabetic chronic kidney disease: Secondary | ICD-10-CM | POA: Diagnosis not present

## 2023-06-20 DIAGNOSIS — I2510 Atherosclerotic heart disease of native coronary artery without angina pectoris: Secondary | ICD-10-CM | POA: Diagnosis not present

## 2023-06-20 DIAGNOSIS — N1831 Chronic kidney disease, stage 3a: Secondary | ICD-10-CM | POA: Diagnosis not present

## 2023-06-20 DIAGNOSIS — E310 Autoimmune polyglandular failure: Secondary | ICD-10-CM | POA: Diagnosis not present

## 2023-06-20 DIAGNOSIS — E209 Hypoparathyroidism, unspecified: Secondary | ICD-10-CM | POA: Diagnosis not present

## 2023-06-20 DIAGNOSIS — E1022 Type 1 diabetes mellitus with diabetic chronic kidney disease: Secondary | ICD-10-CM | POA: Diagnosis not present

## 2023-06-20 DIAGNOSIS — I050 Rheumatic mitral stenosis: Secondary | ICD-10-CM | POA: Diagnosis not present

## 2023-06-20 DIAGNOSIS — J449 Chronic obstructive pulmonary disease, unspecified: Secondary | ICD-10-CM | POA: Diagnosis not present

## 2023-06-20 DIAGNOSIS — I129 Hypertensive chronic kidney disease with stage 1 through stage 4 chronic kidney disease, or unspecified chronic kidney disease: Secondary | ICD-10-CM | POA: Diagnosis not present

## 2023-07-01 DIAGNOSIS — E1022 Type 1 diabetes mellitus with diabetic chronic kidney disease: Secondary | ICD-10-CM | POA: Diagnosis not present

## 2023-07-31 DIAGNOSIS — E1022 Type 1 diabetes mellitus with diabetic chronic kidney disease: Secondary | ICD-10-CM | POA: Diagnosis not present

## 2023-08-18 DIAGNOSIS — Z794 Long term (current) use of insulin: Secondary | ICD-10-CM | POA: Diagnosis not present

## 2023-08-18 DIAGNOSIS — E1022 Type 1 diabetes mellitus with diabetic chronic kidney disease: Secondary | ICD-10-CM | POA: Diagnosis not present

## 2023-08-18 DIAGNOSIS — N1831 Chronic kidney disease, stage 3a: Secondary | ICD-10-CM | POA: Diagnosis not present

## 2023-08-18 DIAGNOSIS — I129 Hypertensive chronic kidney disease with stage 1 through stage 4 chronic kidney disease, or unspecified chronic kidney disease: Secondary | ICD-10-CM | POA: Diagnosis not present

## 2023-08-18 DIAGNOSIS — I2510 Atherosclerotic heart disease of native coronary artery without angina pectoris: Secondary | ICD-10-CM | POA: Diagnosis not present

## 2023-08-31 DIAGNOSIS — E1022 Type 1 diabetes mellitus with diabetic chronic kidney disease: Secondary | ICD-10-CM | POA: Diagnosis not present

## 2023-09-05 DIAGNOSIS — E1022 Type 1 diabetes mellitus with diabetic chronic kidney disease: Secondary | ICD-10-CM | POA: Diagnosis not present

## 2023-09-30 DIAGNOSIS — E1022 Type 1 diabetes mellitus with diabetic chronic kidney disease: Secondary | ICD-10-CM | POA: Diagnosis not present

## 2023-10-25 DIAGNOSIS — E785 Hyperlipidemia, unspecified: Secondary | ICD-10-CM | POA: Diagnosis not present

## 2023-10-25 DIAGNOSIS — Z1212 Encounter for screening for malignant neoplasm of rectum: Secondary | ICD-10-CM | POA: Diagnosis not present

## 2023-10-25 DIAGNOSIS — I129 Hypertensive chronic kidney disease with stage 1 through stage 4 chronic kidney disease, or unspecified chronic kidney disease: Secondary | ICD-10-CM | POA: Diagnosis not present

## 2023-10-25 DIAGNOSIS — E1022 Type 1 diabetes mellitus with diabetic chronic kidney disease: Secondary | ICD-10-CM | POA: Diagnosis not present

## 2023-10-25 DIAGNOSIS — N1831 Chronic kidney disease, stage 3a: Secondary | ICD-10-CM | POA: Diagnosis not present

## 2023-10-25 DIAGNOSIS — E041 Nontoxic single thyroid nodule: Secondary | ICD-10-CM | POA: Diagnosis not present

## 2023-10-31 DIAGNOSIS — E1022 Type 1 diabetes mellitus with diabetic chronic kidney disease: Secondary | ICD-10-CM | POA: Diagnosis not present

## 2023-11-04 DIAGNOSIS — Z23 Encounter for immunization: Secondary | ICD-10-CM | POA: Diagnosis not present

## 2023-11-04 DIAGNOSIS — E1022 Type 1 diabetes mellitus with diabetic chronic kidney disease: Secondary | ICD-10-CM | POA: Diagnosis not present

## 2023-11-04 DIAGNOSIS — J449 Chronic obstructive pulmonary disease, unspecified: Secondary | ICD-10-CM | POA: Diagnosis not present

## 2023-11-04 DIAGNOSIS — N1831 Chronic kidney disease, stage 3a: Secondary | ICD-10-CM | POA: Diagnosis not present

## 2023-11-04 DIAGNOSIS — I2510 Atherosclerotic heart disease of native coronary artery without angina pectoris: Secondary | ICD-10-CM | POA: Diagnosis not present

## 2023-11-04 DIAGNOSIS — E890 Postprocedural hypothyroidism: Secondary | ICD-10-CM | POA: Diagnosis not present

## 2023-11-04 DIAGNOSIS — I050 Rheumatic mitral stenosis: Secondary | ICD-10-CM | POA: Diagnosis not present

## 2023-11-04 DIAGNOSIS — Z1331 Encounter for screening for depression: Secondary | ICD-10-CM | POA: Diagnosis not present

## 2023-11-04 DIAGNOSIS — R82998 Other abnormal findings in urine: Secondary | ICD-10-CM | POA: Diagnosis not present

## 2023-11-04 DIAGNOSIS — I129 Hypertensive chronic kidney disease with stage 1 through stage 4 chronic kidney disease, or unspecified chronic kidney disease: Secondary | ICD-10-CM | POA: Diagnosis not present

## 2023-11-04 DIAGNOSIS — Z0000 Encounter for general adult medical examination without abnormal findings: Secondary | ICD-10-CM | POA: Diagnosis not present

## 2023-11-04 DIAGNOSIS — Z1339 Encounter for screening examination for other mental health and behavioral disorders: Secondary | ICD-10-CM | POA: Diagnosis not present

## 2023-11-04 DIAGNOSIS — I739 Peripheral vascular disease, unspecified: Secondary | ICD-10-CM | POA: Diagnosis not present

## 2023-11-15 DIAGNOSIS — E039 Hypothyroidism, unspecified: Secondary | ICD-10-CM | POA: Diagnosis not present

## 2023-11-15 DIAGNOSIS — Z6821 Body mass index (BMI) 21.0-21.9, adult: Secondary | ICD-10-CM | POA: Diagnosis not present

## 2023-11-15 DIAGNOSIS — N958 Other specified menopausal and perimenopausal disorders: Secondary | ICD-10-CM | POA: Diagnosis not present

## 2023-11-15 DIAGNOSIS — Z01419 Encounter for gynecological examination (general) (routine) without abnormal findings: Secondary | ICD-10-CM | POA: Diagnosis not present

## 2023-11-15 DIAGNOSIS — Z1231 Encounter for screening mammogram for malignant neoplasm of breast: Secondary | ICD-10-CM | POA: Diagnosis not present

## 2023-12-01 DIAGNOSIS — E1022 Type 1 diabetes mellitus with diabetic chronic kidney disease: Secondary | ICD-10-CM | POA: Diagnosis not present

## 2023-12-08 DIAGNOSIS — H353131 Nonexudative age-related macular degeneration, bilateral, early dry stage: Secondary | ICD-10-CM | POA: Diagnosis not present

## 2023-12-08 DIAGNOSIS — E103312 Type 1 diabetes mellitus with moderate nonproliferative diabetic retinopathy with macular edema, left eye: Secondary | ICD-10-CM | POA: Diagnosis not present

## 2023-12-08 DIAGNOSIS — E103391 Type 1 diabetes mellitus with moderate nonproliferative diabetic retinopathy without macular edema, right eye: Secondary | ICD-10-CM | POA: Diagnosis not present

## 2023-12-08 DIAGNOSIS — H43813 Vitreous degeneration, bilateral: Secondary | ICD-10-CM | POA: Diagnosis not present

## 2023-12-20 DIAGNOSIS — E103391 Type 1 diabetes mellitus with moderate nonproliferative diabetic retinopathy without macular edema, right eye: Secondary | ICD-10-CM | POA: Diagnosis not present

## 2023-12-20 DIAGNOSIS — E103312 Type 1 diabetes mellitus with moderate nonproliferative diabetic retinopathy with macular edema, left eye: Secondary | ICD-10-CM | POA: Diagnosis not present

## 2023-12-27 DIAGNOSIS — Z794 Long term (current) use of insulin: Secondary | ICD-10-CM | POA: Diagnosis not present

## 2023-12-27 DIAGNOSIS — I2510 Atherosclerotic heart disease of native coronary artery without angina pectoris: Secondary | ICD-10-CM | POA: Diagnosis not present

## 2023-12-27 DIAGNOSIS — N1831 Chronic kidney disease, stage 3a: Secondary | ICD-10-CM | POA: Diagnosis not present

## 2023-12-27 DIAGNOSIS — E1022 Type 1 diabetes mellitus with diabetic chronic kidney disease: Secondary | ICD-10-CM | POA: Diagnosis not present

## 2023-12-27 DIAGNOSIS — Z23 Encounter for immunization: Secondary | ICD-10-CM | POA: Diagnosis not present

## 2023-12-27 DIAGNOSIS — I129 Hypertensive chronic kidney disease with stage 1 through stage 4 chronic kidney disease, or unspecified chronic kidney disease: Secondary | ICD-10-CM | POA: Diagnosis not present

## 2023-12-31 DIAGNOSIS — E1022 Type 1 diabetes mellitus with diabetic chronic kidney disease: Secondary | ICD-10-CM | POA: Diagnosis not present

## 2024-01-05 DIAGNOSIS — E103391 Type 1 diabetes mellitus with moderate nonproliferative diabetic retinopathy without macular edema, right eye: Secondary | ICD-10-CM | POA: Diagnosis not present

## 2024-01-05 DIAGNOSIS — E103312 Type 1 diabetes mellitus with moderate nonproliferative diabetic retinopathy with macular edema, left eye: Secondary | ICD-10-CM | POA: Diagnosis not present

## 2024-01-31 DIAGNOSIS — E1022 Type 1 diabetes mellitus with diabetic chronic kidney disease: Secondary | ICD-10-CM | POA: Diagnosis not present

## 2024-03-04 NOTE — Progress Notes (Unsigned)
 " Cardiology Office Note:    Date:  03/05/2024   ID:  Abigail Green, DOB 01/18/1946, MRN [id number]  PCP:  Nichole Senior, MD  Cardiologist:  [name], MD  Electrophysiologist:  None   Referring MD: Nichole Senior, MD   Chief Complaint  Patient presents with   Mitral Stenosis    History of Present Illness:    Abigail Green is a 78 y.o. female with a hx of type 1 diabetes, hypertension, hyperlipidemia who presents for follow-up.  She was referred by Dr. Nichole for evaluation of coronary artery disease, initially seen on 06/20/2019.  She reported rare chest pain.   TTE on 10/31/2013 showed LVEF 65 to 70%, grade 1 diastolic dysfunction, mild AI, severe mitral annular calcification, normal RV function.  Cath on 12/30/2015 showed moderate small vessel CAD involving the first diagonal and RCA subbranches.  TTE on 06/29/2019 showed normal biventricular function, mild mitral stenosis, mild aortic regurgitation.  Lexiscan  Myoview  on 06/22/2019 showed normal perfusion.  Echocardiogram 03/16/2023 showed EF 60 to 65%, grade 2 diastolic dysfunction, normal RV function, severe mitral stenosis (mean gradient 12 mmHg, MVA 1.2 cm by continuity equation).  LHC/RHC on 03/23/2023 showed occluded diagonal, severe proximal circumflex stenosis (small vessel), moderate mitral stenosis with mean gradient 7 mmHg, normal right heart pressures.  Since last clinic visit, she reports she is doing okay.  Reports had some recent back/chest pain.  States that she took aspirin  and it resolved.  She walks her dog for exercise unless it is cold.  Does report some dyspnea with exertion but denies exertional chest pain.  Reports no change in her shortness of breath.   Past Medical History:  Diagnosis Date   Cataract    removed bilaterally   Diabetes mellitus type 1 (HCC) 12/29/2015   Essential hypertension 12/29/2015   Family history of premature coronary artery disease 12/29/2015   Hyperlipidemia 12/29/2015    Hypoparathyroidism     Past Surgical History:  Procedure Laterality Date   BREAST CYST EXCISION Left    CARDIAC CATHETERIZATION N/A 12/30/2015   Procedure: Left Heart Cath and Coronary Angiography;  Surgeon: Ozell Fell, MD;  Location: [facility] INVASIVE CV LAB;  Service: Cardiovascular;  Laterality: N/A;   CATARACT EXTRACTION, BILATERAL     CESAREAN SECTION     x 2   RIGHT/LEFT HEART CATH AND CORONARY ANGIOGRAPHY N/A 03/23/2023   Procedure: RIGHT/LEFT HEART CATH AND CORONARY ANGIOGRAPHY;  Surgeon: Fell Ozell, MD;  Location: [facility] INVASIVE CV LAB;  Service: Cardiovascular;  Laterality: N/A;   THYROIDECTOMY     TRIGGER FINGER RELEASE Right    Thumb, A1 pully and incidental synovectomy of superficialis profundus tendons   TRIGGER FINGER RELEASE Left     Current Medications: Current Meds  Medication Sig   amLODipine  (NORVASC ) 5 MG tablet Take 1 tablet (5 mg total) by mouth daily.   aspirin  EC 81 MG tablet Take 81 mg by mouth daily.   atorvastatin (LIPITOR) 80 MG tablet Take 80 mg by mouth every evening.   Calcium Carbonate (CALCIUM 600 PO) Take 2 tablets by mouth in the morning and at bedtime.   Cholecalciferol (VITAMIN D3) 250 MCG (10000 UT) capsule Take 10,000 Units by mouth daily.   Continuous Blood Gluc Sensor (FREESTYLE LIBRE 2 SENSOR) MISC as directed topically change every 14 days to monitor blood glucose continuously   Cyanocobalamin  (VITAMIN B12) 1000 MCG TBCR 1 tablet Orally Once a day   insulin  aspart (NOVOLOG  FLEXPEN) 100 UNIT/ML FlexPen Inject 10-12  Units into the skin 3 (three) times daily with meals.   Insulin  Glargine (LANTUS [place]) Inject 33 Units into the skin in the morning.   Misc Natural Products (BEET ROOT) 500 MG CAPS as directed Orally   Multiple Vitamin (MULTIVITAMIN) capsule Take 1 capsule by mouth daily.   naproxen sodium (ALEVE) 220 MG tablet Take 220 mg by mouth daily as needed (Headache).   Omega-3 Fatty Acids (FISH OIL) 1200 MG CAPS Take 1,200 mg by mouth  daily.   SYNTHROID 112 MCG tablet Take 56-112 mcg by mouth See admin instructions. Take 112 mcg every morning before breakfast and 56 mcg tablet on Wednesday and Sunday   telmisartan (MICARDIS) 80 MG tablet Take 80 mg by mouth daily.   zinc gluconate 50 MG tablet Take 50 mg by mouth daily.   [DISCONTINUED] metoprolol  succinate (TOPROL  XL) 25 MG 24 hr tablet Take 1 tablet (25 mg total) by mouth daily.     Allergies:   Penicillins   Social History   Socioeconomic History   Marital status: Married    Spouse name: Not on file   Number of children: 2   Years of education: Not on file   Highest education level: Not on file  Occupational History   Occupation: retired   Tobacco Use   Smoking status: Never   Smokeless tobacco: Never  Vaping Use   Vaping status: Never Used  Substance and Sexual Activity   Alcohol  use: No   Drug use: No   Sexual activity: Not on file  Other Topics Concern   Not on file  Social History Narrative   Not on file   Social Drivers of Health   Tobacco Use: Low Risk (03/05/2024)   Patient History    Smoking Tobacco Use: Never    Smokeless Tobacco Use: Never    Passive Exposure: Not on file  Financial Resource Strain: Not on file  Food Insecurity: Not on file  Transportation Needs: Not on file  Physical Activity: Not on file  Stress: Not on file  Social Connections: Not on file  Depression (EYV7-0): Not on file  Alcohol  Screen: Not on file  Housing: Not on file  Utilities: Not on file  Health Literacy: Not on file     Family History: The patient's family history includes Addison's disease in her sister; CAD in her mother; COPD in her sister; Diabetes in her brother; Heart failure in her mother. There is no history of Colon cancer, Esophageal cancer, Rectal cancer, or Stomach cancer.  ROS:   Please see the history of present illness.     All other systems reviewed and are negative.  EKGs/Labs/Other Studies Reviewed:    The following studies  were reviewed today:   EKG:   01/20/2021: Normal sinus rhythm, biatrial enlargement, rate 80, no ST abnormality 03/30/2022: Normal sinus rhythm, rate 80, left atrial enlargement, no ST abnormality 03/21/2023: Normal sinus rhythm, rate 86, biatrial enlargement 03/05/2024: Sinus rhythm with PVC, rate 76  TTE 10/31/13: - Left ventricle: The cavity size was normal. Systolic function was    vigorous. The estimated ejection fraction was in the range of 65%    to 70%. Wall motion was normal; there were no regional wall    motion abnormalities. Doppler parameters are consistent with    abnormal left ventricular relaxation (grade 1 diastolic    dysfunction). Doppler parameters are consistent with elevated    ventricular end-diastolic filling pressure.  - Aortic valve: Trileaflet; mildly thickened leaflets.  Transvalvular velocity was within the normal range. There was no    stenosis. There was mild regurgitation.  - Aortic root: The aortic root was normal in size.  - Mitral valve: Severe mitral annular calcification, predominantly    posterior.  - Left atrium: The atrium was mildly dilated.  - Right ventricle: Systolic function was normal.  - Right atrium: The atrium was normal in size.  - Pulmonary arteries: Systolic pressure was within the normal    range.  - Pericardium, extracardiac: There was no pericardial effusion.   Impressions:   - Normal biventricular size and systolic function.    Abnormal relaxation with elevated filling pressures.    Mild aortic regurgitation.   Cath 12/30/15: 1. Moderate small vessel coronary artery disease involving the first diagonal and RCA subbranches. 2. Widely patent RCA, LAD, left main, and left circumflex vessels 3. Normal LVEDP 4. Heavily calcified mitral annulus   Recommendations: Medical therapy   Recent Labs: 03/21/2023: BUN 23; Creatinine, Ser 1.11; Platelets 251 03/23/2023: Hemoglobin 9.9; Potassium 4.1; Sodium 136  Recent Lipid Panel     Component Value Date/Time   CHOL 128 01/20/2021 1416   TRIG 62 01/20/2021 1416   HDL 83 01/20/2021 1416   CHOLHDL 1.5 01/20/2021 1416   LDLCALC 32 01/20/2021 1416    Physical Exam:    VS:  BP (!) 150/62   Pulse 74   Ht 5' 3 (1.6 m)   Wt 125 lb 4.8 oz (56.8 kg)   SpO2 98%   BMI 22.20 kg/m     Wt Readings from Last 3 Encounters:  03/05/24 125 lb 4.8 oz (56.8 kg)  04/12/23 123 lb 11.2 oz (56.1 kg)  03/23/23 125 lb (56.7 kg)     GEN:   in no acute distress HEENT: Normal NECK: No JVD CARDIAC: RRR, no murmurs, rubs, gallops RESPIRATORY:  Clear to auscultation without rales, wheezing or rhonchi  ABDOMEN: Soft, non-tender, non-distended MUSCULOSKELETAL:  No edema; No deformity  SKIN: Warm and dry NEUROLOGIC:  Alert and oriented x 3 PSYCHIATRIC:  Normal affect   ASSESSMENT:    1. Mitral valve stenosis, unspecified etiology   2. Coronary artery disease involving native coronary artery of native heart, unspecified whether angina present   3. Essential hypertension   4. Hyperlipidemia, unspecified hyperlipidemia type       PLAN:    Mitral stenosis: Echocardiogram 03/16/2023 showed EF 60 to 65%, grade 2 diastolic dysfunction, normal RV function, severe mitral stenosis (mean gradient 12 mmHg, MVA 1.2 cm by continuity equation).  LHC/RHC on 03/23/2023 showed occluded diagonal, severe proximal circumflex stenosis (small vessel), moderate mitral stenosis with mean gradient 7 mmHg, normal right heart pressures. - Increase Toprol -XL dose to 50 mg daily to decrease heart rate to improve gradients.  RHC/LHC after starting metoprolol  showed improvement in gradients, suggested moderate mitral stenosis.  She also reports improvement in her dyspnea on exertion since starting metoprolol .  Continue medical management at this time.  - Update echocardiogram  CAD: Cath on 12/30/2015 showed moderate small vessel CAD involving the first diagonal and RCA subbranches.  Normal LV systolic function  on TTE in 2015.  Having shortness of breath with minimal exertion  TTE on 06/29/2019 showed normal biventricular function, mild mitral stenosis, mild aortic regurgitation.  Lexiscan  Myoview  on 06/22/2019 showed normal perfusion.  LHC/RHC on 03/23/2023 showed occluded diagonal, severe proximal circumflex stenosis (small vessel) -Continue aspirin  81 mg daily. -Continue atorvastatin 80 mg daily - Increase Toprol -XL dose to 50 mg daily  Aortic regurgitation: Mild on TTE 06/29/19.  Trivial on echo 03/16/2023  Hyperlipidemia: On atorvastatin 80 mg daily.  LDL 39 on 10/08/2022  Hypertension: On telmisartan 80 mg daily and amlodipine  5 mg daily and Toprol -XL 25 mg daily.  BP elevated, I will increase Toprol -XL dose to 50 mg daily as above.  Asked to check BP/heart rate twice daily for next 2 weeks and let us  know results  Type 1 diabetes: On insulin .  A1c 7.9% on 06/20/2023  Leg pain: Arterial duplex 04/2022 showed heavily calcified vessels without evidence of significant stenosis  RTC in 6 months  Medication Adjustments/Labs and Tests Ordered: Current medicines are reviewed at length with the patient today.  Concerns regarding medicines are outlined above.  Orders Placed This Encounter  Procedures   EKG 12-Lead   ECHOCARDIOGRAM COMPLETE    Meds ordered this encounter  Medications   metoprolol  succinate (TOPROL  XL) 50 MG 24 hr tablet    Sig: Take 1 tablet (50 mg total) by mouth daily.    Dispense:  90 tablet    Refill:  3    NEW DOSE, D/C 25 MG RX     Patient Instructions  Medication Instructions:  INCREASE YOUR METOPROLOL  TO 50 MG DAILY   *If you need a refill on your cardiac medications before your next appointment, please call your pharmacy*  Lab Work: NONE  Testing/Procedures: Your physician has requested that you have an echocardiogram. Echocardiography is a painless test that uses sound waves to create images of your heart. It provides your doctor with information about the size and  shape of your heart and how well your hearts chambers and valves are working. This procedure takes approximately one hour. There are no restrictions for this procedure. Please do NOT wear cologne, perfume, aftershave, or lotions (deodorant is allowed). Please arrive 15 minutes prior to your appointment time.  Please note: We ask at that you not bring children with you during ultrasound (echo/ vascular) testing. Due to room size and safety concerns, children are not allowed in the ultrasound rooms during exams. Our front office staff cannot provide observation of children in our lobby area while testing is being conducted. An adult accompanying a patient to their appointment will only be allowed in the ultrasound room at the discretion of the ultrasound technician under special circumstances. We apologize for any inconvenience.  Follow-Up: At [facility], you and your health needs are our priority.  As part of our continuing mission to provide you with exceptional heart care, our providers are all part of one team.  This team includes your primary Cardiologist (physician) and Advanced Practice Providers or APPs (Physician Assistants and Nurse Practitioners) who all work together to provide you with the care you need, when you need it.  Your next appointment:   6 month(s)  Provider:   [name], MD OR APP    We recommend signing up for the patient portal called MyChart.  Sign up information is provided on this After Visit Summary.  MyChart is used to connect with patients for Virtual Visits (Telemedicine).  Patients are able to view lab/test results, encounter notes, upcoming appointments, etc.  Non-urgent messages can be sent to your provider as well.   To learn more about what you can do with MyChart, go to [email or web address].   Other Instructions MONITOR BLOOD PRESSURE AND HEART RATE TWICE A DAY FOR 2 WEEKS. SEND READINGS VIA MYCHART            Signed,  [name], MD  03/05/2024 4:28 PM    [facility] "

## 2024-03-05 ENCOUNTER — Other Ambulatory Visit: Payer: Self-pay | Admitting: Cardiology

## 2024-03-05 ENCOUNTER — Ambulatory Visit (HOSPITAL_BASED_OUTPATIENT_CLINIC_OR_DEPARTMENT_OTHER): Admitting: Cardiology

## 2024-03-05 ENCOUNTER — Encounter (HOSPITAL_BASED_OUTPATIENT_CLINIC_OR_DEPARTMENT_OTHER): Payer: Self-pay | Admitting: Cardiology

## 2024-03-05 VITALS — BP 150/62 | HR 74 | Ht 63.0 in | Wt 125.3 lb

## 2024-03-05 DIAGNOSIS — E785 Hyperlipidemia, unspecified: Secondary | ICD-10-CM | POA: Diagnosis not present

## 2024-03-05 DIAGNOSIS — I10 Essential (primary) hypertension: Secondary | ICD-10-CM | POA: Diagnosis not present

## 2024-03-05 DIAGNOSIS — I342 Nonrheumatic mitral (valve) stenosis: Secondary | ICD-10-CM | POA: Diagnosis not present

## 2024-03-05 DIAGNOSIS — I2510 Atherosclerotic heart disease of native coronary artery without angina pectoris: Secondary | ICD-10-CM | POA: Diagnosis not present

## 2024-03-05 DIAGNOSIS — I050 Rheumatic mitral stenosis: Secondary | ICD-10-CM

## 2024-03-05 MED ORDER — METOPROLOL SUCCINATE ER 50 MG PO TB24: 50.0000 mg | ORAL_TABLET | Freq: Every day | ORAL | 3 refills | Status: AC

## 2024-03-05 NOTE — Patient Instructions (Addendum)
 Medication Instructions:  INCREASE YOUR METOPROLOL  TO 50 MG DAILY   *If you need a refill on your cardiac medications before your next appointment, please call your pharmacy*  Lab Work: NONE  Testing/Procedures: Your physician has requested that you have an echocardiogram. Echocardiography is a painless test that uses sound waves to create images of your heart. It provides your doctor with information about the size and shape of your heart and how well your hearts chambers and valves are working. This procedure takes approximately one hour. There are no restrictions for this procedure. Please do NOT wear cologne, perfume, aftershave, or lotions (deodorant is allowed). Please arrive 15 minutes prior to your appointment time.  Please note: We ask at that you not bring children with you during ultrasound (echo/ vascular) testing. Due to room size and safety concerns, children are not allowed in the ultrasound rooms during exams. Our front office staff cannot provide observation of children in our lobby area while testing is being conducted. An adult accompanying a patient to their appointment will only be allowed in the ultrasound room at the discretion of the ultrasound technician under special circumstances. We apologize for any inconvenience.  Follow-Up: At [facility], you and your health needs are our priority.  As part of our continuing mission to provide you with exceptional heart care, our providers are all part of one team.  This team includes your primary Cardiologist (physician) and Advanced Practice Providers or APPs (Physician Assistants and Nurse Practitioners) who all work together to provide you with the care you need, when you need it.  Your next appointment:   6 month(s)  Provider:   [name], MD OR APP    We recommend signing up for the patient portal called MyChart.  Sign up information is provided on this After Visit Summary.  MyChart is used to  connect with patients for Virtual Visits (Telemedicine).  Patients are able to view lab/test results, encounter notes, upcoming appointments, etc.  Non-urgent messages can be sent to your provider as well.   To learn more about what you can do with MyChart, go to [email or web address].   Other Instructions MONITOR BLOOD PRESSURE AND HEART RATE TWICE A DAY FOR 2 WEEKS. SEND READINGS VIA MYCHART

## 2024-03-06 NOTE — Telephone Encounter (Signed)
 Amlodipine  5mg  refill Dx-Essential HTN Last OV: 03/05/24 Dr. Kate   Per last OV it states: Hypertension: On telmisartan 80 mg daily and amlodipine  5 mg daily and Toprol -XL 25 mg daily.  BP elevated, I will increase Toprol -XL dose to 50 mg daily as above.  Asked to check BP/heart rate twice daily for next 2 weeks and let us  know results

## 2024-04-04 ENCOUNTER — Ambulatory Visit (HOSPITAL_BASED_OUTPATIENT_CLINIC_OR_DEPARTMENT_OTHER)

## 2024-04-04 DIAGNOSIS — I10 Essential (primary) hypertension: Secondary | ICD-10-CM

## 2024-04-04 DIAGNOSIS — I050 Rheumatic mitral stenosis: Secondary | ICD-10-CM | POA: Diagnosis not present

## 2024-04-05 ENCOUNTER — Ambulatory Visit: Payer: Self-pay | Admitting: Cardiology

## 2024-04-05 LAB — ECHOCARDIOGRAM COMPLETE
AR max vel: 1.94 cm2
AV Area VTI: 1.57 cm2
AV Area mean vel: 1.77 cm2
AV Mean grad: 5.7 mmHg
AV Peak grad: 10.3 mmHg
AV Vena cont: 0.4 cm
Ao pk vel: 1.6 m/s
Area-P 1/2: 3.03 cm2
MV VTI: 0.88 cm2
P 1/2 time: 590 ms
S' Lateral: 2.29 cm

## 2024-04-08 NOTE — Progress Notes (Unsigned)
 " Cardiology Office Note:    Date:  04/09/2024   ID:  Abigail Green, DOB [birth year], MRN [id number]  PCP:  Nichole Senior, MD  Cardiologist:  [name], MD  Electrophysiologist:  None   Referring MD: Nichole Senior, MD   Chief Complaint  Patient presents with   Shortness of Breath    History of Present Illness:    Abigail Green is a 79 y.o. female with a hx of type 1 diabetes, hypertension, hyperlipidemia who presents for follow-up.  She was referred by Dr. Nichole for evaluation of coronary artery disease, initially seen on 06/20/2019.  She reported rare chest pain.   TTE on 10/31/2013 showed LVEF 65 to 70%, grade 1 diastolic dysfunction, mild AI, severe mitral annular calcification, normal RV function.  Cath on 12/30/2015 showed moderate small vessel CAD involving the first diagonal and RCA subbranches.  TTE on 06/29/2019 showed normal biventricular function, mild mitral stenosis, mild aortic regurgitation.  Lexiscan  Myoview  on 06/22/2019 showed normal perfusion.  Echocardiogram 03/16/2023 showed EF 60 to 65%, grade 2 diastolic dysfunction, normal RV function, severe mitral stenosis (mean gradient 12 mmHg, MVA 1.2 cm by continuity equation).  LHC/RHC on 03/23/2023 showed occluded diagonal, severe proximal circumflex stenosis (small vessel), moderate mitral stenosis with mean gradient 7 mmHg, normal right heart pressures.  Echo 04/04/2024 showed EF 60 to 65%, normal RV function, moderate left atrial enlargement, severe mitral stenosis.  Since last clinic visit, she reports she continues to have some dyspnea with exertion, notices it when walking up hills.  Reports that she has not been exerting herself very much recently.  She used to dance but had to stop because of dyspnea.  She had some chest pain when lying in bed at night, took aspirin  and it improved.  Denies any exertional chest pain.  She reports no lightheadedness or syncope.     Past Medical History:  Diagnosis Date    Cataract    removed bilaterally   Diabetes mellitus type 1 (HCC) 12/29/2015   Essential hypertension 12/29/2015   Family history of premature coronary artery disease 12/29/2015   Hyperlipidemia 12/29/2015   Hypoparathyroidism     Past Surgical History:  Procedure Laterality Date   BREAST CYST EXCISION Left    CARDIAC CATHETERIZATION N/A 12/30/2015   Procedure: Left Heart Cath and Coronary Angiography;  Surgeon: Ozell Fell, MD;  Location: [facility] INVASIVE CV LAB;  Service: Cardiovascular;  Laterality: N/A;   CATARACT EXTRACTION, BILATERAL     CESAREAN SECTION     x 2   RIGHT/LEFT HEART CATH AND CORONARY ANGIOGRAPHY N/A 03/23/2023   Procedure: RIGHT/LEFT HEART CATH AND CORONARY ANGIOGRAPHY;  Surgeon: Fell Ozell, MD;  Location: [facility] INVASIVE CV LAB;  Service: Cardiovascular;  Laterality: N/A;   THYROIDECTOMY     TRIGGER FINGER RELEASE Right    Thumb, A1 pully and incidental synovectomy of superficialis profundus tendons   TRIGGER FINGER RELEASE Left     Current Medications: Current Meds  Medication Sig   amLODipine  (NORVASC ) 5 MG tablet TAKE 1 TABLET(5 MG) BY MOUTH DAILY   aspirin  EC 81 MG tablet Take 81 mg by mouth daily.   atorvastatin (LIPITOR) 80 MG tablet Take 80 mg by mouth every evening.   Calcium Carbonate (CALCIUM 600 PO) Take 2 tablets by mouth in the morning and at bedtime.   Cholecalciferol (VITAMIN D3) 250 MCG (10000 UT) capsule Take 10,000 Units by mouth daily.   Continuous Blood Gluc Sensor (FREESTYLE LIBRE 2 SENSOR) MISC as directed  topically change every 14 days to monitor blood glucose continuously   Cyanocobalamin  (VITAMIN B12) 1000 MCG TBCR 1 tablet Orally Once a day   insulin  aspart (NOVOLOG  FLEXPEN) 100 UNIT/ML FlexPen Inject 10-12 Units into the skin 3 (three) times daily with meals.   insulin  aspart protamine - aspart (NOVOLOG  MIX 70/30 FLEXPEN) (70-30) 100 UNIT/ML FlexPen Inject by subcutaneous route.   Insulin  Glargine (LANTUS Briggs) Inject 33 Units into the  skin in the morning.   metoprolol  succinate (TOPROL  XL) 50 MG 24 hr tablet Take 1 tablet (50 mg total) by mouth daily.   Misc Natural Products (BEET ROOT) 500 MG CAPS as directed Orally   Multiple Vitamin (MULTIVITAMIN) capsule Take 1 capsule by mouth daily.   naproxen sodium (ALEVE) 220 MG tablet Take 220 mg by mouth daily as needed (Headache).   Omega-3 Fatty Acids (FISH OIL) 1200 MG CAPS Take 1,200 mg by mouth daily.   SYNTHROID 112 MCG tablet Take 56-112 mcg by mouth See admin instructions. Take 112 mcg every morning before breakfast and 56 mcg tablet on Wednesday and Sunday   telmisartan (MICARDIS) 80 MG tablet Take 80 mg by mouth daily.   zinc gluconate 50 MG tablet Take 50 mg by mouth daily.     Allergies:   Penicillins   Social History   Socioeconomic History   Marital status: Married    Spouse name: Not on file   Number of children: 2   Years of education: Not on file   Highest education level: 12th grade  Occupational History   Occupation: retired   Tobacco Use   Smoking status: Never   Smokeless tobacco: Never  Vaping Use   Vaping status: Never Used  Substance and Sexual Activity   Alcohol  use: No   Drug use: No   Sexual activity: Not on file  Other Topics Concern   Not on file  Social History Narrative   Not on file   Social Drivers of Health   Tobacco Use: Low Risk (04/09/2024)   Patient History    Smoking Tobacco Use: Never    Smokeless Tobacco Use: Never    Passive Exposure: Not on file  Financial Resource Strain: Low Risk (04/06/2024)   Overall Financial Resource Strain (CARDIA)    Difficulty of Paying Living Expenses: Not hard at all  Food Insecurity: No Food Insecurity (04/06/2024)   Epic    Worried About Radiation Protection Practitioner of Food in the Last Year: Never true    Ran Out of Food in the Last Year: Never true  Transportation Needs: No Transportation Needs (04/06/2024)   Epic    Lack of Transportation (Medical): No    Lack of Transportation (Non-Medical): No   Physical Activity: Inactive (04/06/2024)   Exercise Vital Sign    Days of Exercise per Week: 0 days    Minutes of Exercise per Session: Not on file  Stress: No Stress Concern Present (04/06/2024)   [employer] of Occupational Health - Occupational Stress Questionnaire    Feeling of Stress: Not at all  Social Connections: Moderately Integrated (04/06/2024)   Social Connection and Isolation Panel    Frequency of Communication with Friends and Family: More than three times a week    Frequency of Social Gatherings with Friends and Family: Once a week    Attends Religious Services: More than 4 times per year    Active Member of [employer] or Organizations: No    Attends Banker Meetings: Not on file  Marital Status: Married  Depression (EYV7-0): Not on file  Alcohol  Screen: Not on file  Housing: Unknown (04/06/2024)   Epic    Unable to Pay for Housing in the Last Year: No    Number of Times Moved in the Last Year: Not on file    Homeless in the Last Year: No  Utilities: Not on file  Health Literacy: Not on file     Family History: The patient's family history includes Addison's disease in her sister; CAD in her mother; COPD in her sister; Diabetes in her brother; Heart failure in her mother. There is no history of Colon cancer, Esophageal cancer, Rectal cancer, or Stomach cancer.  ROS:   Please see the history of present illness.     All other systems reviewed and are negative.  EKGs/Labs/Other Studies Reviewed:    The following studies were reviewed today:   EKG:   01/20/2021: Normal sinus rhythm, biatrial enlargement, rate 80, no ST abnormality 03/30/2022: Normal sinus rhythm, rate 80, left atrial enlargement, no ST abnormality 03/21/2023: Normal sinus rhythm, rate 86, biatrial enlargement 03/05/2024: Sinus rhythm with PVC, rate 76  TTE 10/31/13: - Left ventricle: The cavity size was normal. Systolic function was    vigorous. The estimated ejection fraction was  in the range of 65%    to 70%. Wall motion was normal; there were no regional wall    motion abnormalities. Doppler parameters are consistent with    abnormal left ventricular relaxation (grade 1 diastolic    dysfunction). Doppler parameters are consistent with elevated    ventricular end-diastolic filling pressure.  - Aortic valve: Trileaflet; mildly thickened leaflets.    Transvalvular velocity was within the normal range. There was no    stenosis. There was mild regurgitation.  - Aortic root: The aortic root was normal in size.  - Mitral valve: Severe mitral annular calcification, predominantly    posterior.  - Left atrium: The atrium was mildly dilated.  - Right ventricle: Systolic function was normal.  - Right atrium: The atrium was normal in size.  - Pulmonary arteries: Systolic pressure was within the normal    range.  - Pericardium, extracardiac: There was no pericardial effusion.   Impressions:   - Normal biventricular size and systolic function.    Abnormal relaxation with elevated filling pressures.    Mild aortic regurgitation.   Cath 12/30/15: 1. Moderate small vessel coronary artery disease involving the first diagonal and RCA subbranches. 2. Widely patent RCA, LAD, left main, and left circumflex vessels 3. Normal LVEDP 4. Heavily calcified mitral annulus   Recommendations: Medical therapy   Recent Labs: No results found for requested labs within last 365 days.  Recent Lipid Panel    Component Value Date/Time   CHOL 128 01/20/2021 1416   TRIG 62 01/20/2021 1416   HDL 83 01/20/2021 1416   CHOLHDL 1.5 01/20/2021 1416   LDLCALC 32 01/20/2021 1416    Physical Exam:    VS:  BP 114/60   Pulse 76   Ht 5' 3 (1.6 m)   Wt 128 lb (58.1 kg)   SpO2 95%   BMI 22.67 kg/m     Wt Readings from Last 3 Encounters:  04/09/24 128 lb (58.1 kg)  03/05/24 125 lb 4.8 oz (56.8 kg)  04/12/23 123 lb 11.2 oz (56.1 kg)     GEN:   in no acute distress HEENT:  Normal NECK: No JVD CARDIAC: RRR, no murmurs, rubs, gallops RESPIRATORY:  Clear to auscultation without  rales, wheezing or rhonchi  ABDOMEN: Soft, non-tender, non-distended MUSCULOSKELETAL:  No edema; No deformity  SKIN: Warm and dry NEUROLOGIC:  Alert and oriented x 3 PSYCHIATRIC:  Normal affect   ASSESSMENT:    1. Mitral valve stenosis, unspecified etiology   2. Coronary artery disease involving native coronary artery of native heart, unspecified whether angina present   3. Essential hypertension   4. Hyperlipidemia, unspecified hyperlipidemia type        PLAN:    Mitral stenosis: Echocardiogram 03/16/2023 showed EF 60 to 65%, grade 2 diastolic dysfunction, normal RV function, severe mitral stenosis (mean gradient 12 mmHg, MVA 1.2 cm by continuity equation).  LHC/RHC on 03/23/2023 showed occluded diagonal, severe proximal circumflex stenosis (small vessel), moderate mitral stenosis with mean gradient 7 mmHg, normal right heart pressures.  Echo 04/04/2024 suggested severe mitral stenosis (mean gradient 8 mmHg, MVA 0.9 cm by continuity equation) - Continue Toprol -XL 50 mg daily, have seen improvement in gradients and symptoms since titrating up metoprolol .  Resting heart rate in 50s, no room to titrate medication.  She does continue to have dyspnea on exertion and question whether this is coming from her mitral stenosis.  With normal pressures on right heart cath previously at rest, I think it may be worthwhile to do an exercise RHC to see if elevated pressures are seen when she exerts herself due to her mitral stenosis.  Will discuss with Dr. Wonda  CAD: Cath on 12/30/2015 showed moderate small vessel CAD involving the first diagonal and RCA subbranches.  Normal LV systolic function on TTE in 2015.  Having shortness of breath with minimal exertion  TTE on 06/29/2019 showed normal biventricular function, mild mitral stenosis, mild aortic regurgitation.  Lexiscan  Myoview  on 06/22/2019 showed  normal perfusion.  LHC/RHC on 03/23/2023 showed occluded diagonal, severe proximal circumflex stenosis (small vessel) -Continue aspirin  81 mg daily. -Continue atorvastatin 80 mg daily -Continue Toprol -XL dose to 50 mg daily  Hyperlipidemia: On atorvastatin 80 mg daily.  LDL 39 on 10/08/2022  Hypertension: On telmisartan 80 mg daily and amlodipine  5 mg daily and Toprol -XL 50 mg daily  Type 1 diabetes: On insulin .  A1c 7.9% on 06/20/2023  Leg pain: Arterial duplex 04/2022 showed heavily calcified vessels without evidence of significant stenosis  RTC in 6 months  Medication Adjustments/Labs and Tests Ordered: Current medicines are reviewed at length with the patient today.  Concerns regarding medicines are outlined above.  No orders of the defined types were placed in this encounter.   No orders of the defined types were placed in this encounter.    Patient Instructions  Medication Instructions:  Your physician recommends that you continue on your current medications as directed. Please refer to the Current Medication list given to you today.  *If you need a refill on your cardiac medications before your next appointment, please call your pharmacy*   Follow-Up: At [facility], you and your health needs are our priority.  As part of our continuing mission to provide you with exceptional heart care, our providers are all part of one team.  This team includes your primary Cardiologist (physician) and Advanced Practice Providers or APPs (Physician Assistants and Nurse Practitioners) who all work together to provide you with the care you need, when you need it.  Your next appointment:   3 month(s)  Provider:   [name], MD                Signed, [name], MD  04/09/2024  1:08 PM    [facility] "

## 2024-04-09 ENCOUNTER — Ambulatory Visit: Admitting: Cardiology

## 2024-04-09 ENCOUNTER — Encounter: Payer: Self-pay | Admitting: Cardiology

## 2024-04-09 VITALS — BP 114/60 | HR 76 | Ht 63.0 in | Wt 128.0 lb

## 2024-04-09 DIAGNOSIS — I2510 Atherosclerotic heart disease of native coronary artery without angina pectoris: Secondary | ICD-10-CM

## 2024-04-09 DIAGNOSIS — E785 Hyperlipidemia, unspecified: Secondary | ICD-10-CM

## 2024-04-09 DIAGNOSIS — I050 Rheumatic mitral stenosis: Secondary | ICD-10-CM

## 2024-04-09 DIAGNOSIS — I10 Essential (primary) hypertension: Secondary | ICD-10-CM | POA: Diagnosis not present

## 2024-04-09 DIAGNOSIS — I349 Nonrheumatic mitral valve disorder, unspecified: Secondary | ICD-10-CM

## 2024-04-09 NOTE — Patient Instructions (Signed)
 Medication Instructions:  Your physician recommends that you continue on your current medications as directed. Please refer to the Current Medication list given to you today. If you need a refill on your cardiac medications before your next appointment, please call your pharmacy*   Follow-Up: At [facility], you and your health needs are our priority.  As part of our continuing mission to provide you with exceptional heart care, our providers are all part of one team.  This team includes your primary Cardiologist (physician) and Advanced Practice Providers or APPs (Physician Assistants and Nurse Practitioners) who all work together to provide you with the care you need, when you need it.  Your next appointment:   3 month(s)  Provider:   [name], MD

## 2024-07-25 ENCOUNTER — Ambulatory Visit: Admitting: Cardiology
